# Patient Record
Sex: Male | Born: 1950 | Race: Black or African American | Hispanic: No | Marital: Single | Smoking: Never smoker
Health system: Southern US, Community
[De-identification: ages and names within clinical notes are randomized; demographics above are authoritative.]

---

## 2005-02-15 ENCOUNTER — Emergency Department (HOSPITAL_COMMUNITY): Admission: EM | Admit: 2005-02-15 | Discharge: 2005-02-16 | Payer: Self-pay | Admitting: Emergency Medicine
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
## 2016-12-03 LAB — LIPID PANEL
Cholesterol: 192 (ref 0–200)
HDL: 45 (ref 35–70)
LDL Cholesterol: 128
Triglycerides: 87 (ref 40–160)

## 2016-12-03 LAB — BASIC METABOLIC PANEL
BUN: 26 — AB (ref 4–21)
CREATININE: 1 (ref 0.6–1.3)
GLUCOSE: 95
POTASSIUM: 4.5 (ref 3.4–5.3)
Sodium: 138 (ref 137–147)

## 2016-12-03 LAB — PSA: PSA: 0.4

## 2016-12-03 LAB — CBC AND DIFFERENTIAL
HCT: 41 (ref 41–53)
Hemoglobin: 14 (ref 13.5–17.5)
Platelets: 266 (ref 150–399)
WBC: 5.3

## 2016-12-03 LAB — HEPATIC FUNCTION PANEL
ALT: 17 (ref 10–40)
AST: 20 (ref 14–40)

## 2016-12-03 LAB — TSH: TSH: 1.39 (ref 0.41–5.90)

## 2017-01-27 ENCOUNTER — Ambulatory Visit: Payer: Self-pay | Admitting: Nurse Practitioner

## 2017-01-27 ENCOUNTER — Telehealth: Payer: Self-pay | Admitting: Nurse Practitioner

## 2017-01-27 ENCOUNTER — Ambulatory Visit (INDEPENDENT_AMBULATORY_CARE_PROVIDER_SITE_OTHER): Payer: 59 | Admitting: Nurse Practitioner

## 2017-01-27 ENCOUNTER — Encounter: Payer: Self-pay | Admitting: Nurse Practitioner

## 2017-01-27 VITALS — BP 122/72 | HR 56 | Temp 98.4°F | Ht 67.0 in | Wt 197.0 lb

## 2017-01-27 DIAGNOSIS — Z1211 Encounter for screening for malignant neoplasm of colon: Secondary | ICD-10-CM | POA: Diagnosis not present

## 2017-01-27 DIAGNOSIS — Z23 Encounter for immunization: Secondary | ICD-10-CM

## 2017-01-27 DIAGNOSIS — R195 Other fecal abnormalities: Secondary | ICD-10-CM

## 2017-01-27 DIAGNOSIS — I499 Cardiac arrhythmia, unspecified: Secondary | ICD-10-CM | POA: Diagnosis not present

## 2017-01-27 NOTE — Patient Instructions (Addendum)
Please sign medical release form to get records from Dr. Fleet ContrasEdwin Avbuere.  You will be contacted about cologuard.  Please inquire [facility] if retinal exam and eye pressure was checked during your recent visit. Also have him fax a copy of his exam.   Palpitations A palpitation is the feeling that your heart:  Has an uneven (irregular) heartbeat.  Is beating faster than normal.  Is fluttering.  Is skipping a beat.  This is usually not a serious problem. In some cases, you may need more medical tests. Follow these instructions at home:  Avoid: ? Caffeine in coffee, tea, soft drinks, diet pills, and energy drinks. ? Chocolate. ? Alcohol.  Do not use any tobacco products. These include cigarettes, chewing tobacco, and e-cigarettes. If you need help quitting, ask your doctor.  Try to reduce your stress. These things may help: ? Yoga. ? Meditation. ? Physical activity. Swimming, jogging, and walking are good choices. ? A method that helps you use your mind to control things in your body, like heartbeats (biofeedback).  Get plenty of rest and sleep.  Take over-the-counter and prescription medicines only as told by your doctor.  Keep all follow-up visits as told by your doctor. This is important. Contact a doctor if:  Your heartbeat is still fast or uneven after 24 hours.  Your palpitations occur more often. Get help right away if:  You have chest pain.  You feel short of breath.  You have a very bad headache.  You feel dizzy.  You pass out (faint). This information is not intended to replace advice given to you by your health care provider. Make sure you discuss any questions you have with your health care provider. Document Released: 11/18/2007 Document Revised: 07/17/2015 Document Reviewed: 10/24/2014 Elsevier Interactive Patient Education  [employer]2018 Elsevier Inc.

## 2017-01-27 NOTE — Progress Notes (Signed)
Subjective:    Patient ID: Ronald Lara, male    DOB: 1950-11-16, 66 y.o.   MRN: [id number]  Patient presents today for complete physical  Palpitations   This is a chronic problem. The current episode started more than 1 year ago. The problem occurs intermittently. The problem has been unchanged. The symptoms are aggravated by unknown. Pertinent negatives include no anxiety, chest pain, coughing, dizziness, fever, malaise/fatigue, near-syncope, shortness of breath or syncope. He has tried nothing for the symptoms. Risk factors include being male. There is no history of anxiety or drug use.    previous pcp: Dr. Fleet ContrasEdwin [place] Last seen 11/2016. He reports he had labs done during this OV.  Reports he had [facility] visit [facility] last month.  Immunizations: (TDAP, Hep C screen, Pneumovax, Influenza, zoster)  Health Maintenance  Topic Date Due  .  Hepatitis C: One time screening is recommended by Center for Disease Control  (CDC) for  adults born from [birth year] through 1965.   1950-11-16  . Tetanus Vaccine  01/04/1969  . Colon Cancer Screening  01/05/2000  . Pneumonia vaccines (2 of 2 - PPSV23) 01/27/2018  . Flu Shot  Completed   Colonoscopy (every 5-7818yrs, >50-8187yrs):needed. cologuard will be ordered  PSA (yearly, >5449yrs):needed  Vision:had ophthalmology exam done by dr. Milinda Caveickey with [facility] last month.  Dental:needed   Medications and allergies reviewed with patient and updated if appropriate.  There are no active problems to display for this patient.   No current outpatient medications on file prior to visit.   No current facility-administered medications on file prior to visit.     History reviewed. No pertinent past medical history.  History reviewed. No pertinent surgical history.  Social History   Socioeconomic History  . Marital status: Single    Spouse name: None  . Number of children: None  . Years of education: None  . Highest  education level: 11th grade  Social Needs  . Financial resource strain: None  . Food insecurity - worry: None  . Food insecurity - inability: None  . Transportation needs - medical: None  . Transportation needs - non-medical: None  Occupational History  . Occupation: disability  Tobacco Use  . Smoking status: Never Smoker  . Smokeless tobacco: Never Used  Substance and Sexual Activity  . Alcohol use: No    Frequency: Never  . Drug use: No  . Sexual activity: Not Currently  Other Topics Concern  . None  Social History Narrative  . None    Family History  Problem Relation Age of Onset  . Diabetes Mother   . Heart disease Brother        Review of Systems  Constitutional: Negative for fever, malaise/fatigue and weight loss.  HENT: Negative for congestion and sore throat.   Eyes:       Negative for visual changes  Respiratory: Negative for cough and shortness of breath.   Cardiovascular: Negative for chest pain, palpitations, leg swelling, syncope and near-syncope.  Gastrointestinal: Negative for blood in stool, constipation, diarrhea and heartburn.  Genitourinary: Negative for dysuria, frequency and urgency.  Musculoskeletal: Negative for falls, joint pain and myalgias.  Skin: Negative for rash.  Neurological: Negative for dizziness, sensory change and headaches.  Endo/Heme/Allergies: Does not bruise/bleed easily.  Psychiatric/Behavioral: Negative for depression, substance abuse and suicidal ideas. The patient is not nervous/anxious.     Objective:   Vitals:   01/27/17 1127  BP: 122/72  Pulse: (!) 56  Temp: 98.4 F (36.9 C)  SpO2: 97%    Body mass index is 30.85 kg/m.  ECG indicates Sinus arrhthymia, no previous tracing to compare.  Physical Examination:  Physical Exam  Constitutional: He is oriented to person, place, and time. No distress.  Cardiovascular: Intact distal pulses. An irregular rhythm present. Exam reveals no gallop.  No murmur  heard. Pulmonary/Chest: Effort normal and breath sounds normal.  Musculoskeletal: He exhibits no edema.  Neurological: He is alert and oriented to person, place, and time. Gait normal.  Skin: Skin is warm and dry.  Psychiatric: Affect normal.  Vitals reviewed.   ASSESSMENT and PLAN:  Ronald Lara was seen today for establish care.  Diagnoses and all orders for this visit:  Irregular heart rate -     EKG 12-Lead  Screening for colon cancer  Need for vaccination with 13-polyvalent pneumococcal conjugate vaccine -     Pneumococcal conjugate vaccine 13-valent IM   No problem-specific Assessment & Plan notes found for this encounter.     Follow up: Return in about 4 weeks (around 02/24/2017) for BP re eval..  Alysia Pennaharlotte Donny Heffern, NP

## 2017-01-27 NOTE — Telephone Encounter (Signed)
Cologuard ordered. Order [id number].   Waiting for the result.

## 2017-01-28 ENCOUNTER — Encounter: Payer: Self-pay | Admitting: Nurse Practitioner

## 2017-02-02 ENCOUNTER — Encounter: Payer: Self-pay | Admitting: Nurse Practitioner

## 2017-02-02 NOTE — Progress Notes (Signed)
Abstracted result and sent to scan  

## 2017-02-25 ENCOUNTER — Encounter: Payer: Self-pay | Admitting: Nurse Practitioner

## 2017-02-25 ENCOUNTER — Ambulatory Visit (INDEPENDENT_AMBULATORY_CARE_PROVIDER_SITE_OTHER): Payer: 59 | Admitting: Nurse Practitioner

## 2017-02-25 VITALS — BP 130/70 | HR 56 | Temp 98.2°F | Ht 67.0 in | Wt 201.0 lb

## 2017-02-25 DIAGNOSIS — I498 Other specified cardiac arrhythmias: Secondary | ICD-10-CM | POA: Insufficient documentation

## 2017-02-25 NOTE — Patient Instructions (Signed)
Contact your insurance about cost of cologuard.   Bradycardia, Adult Bradycardia is a slower-than-normal heartbeat. A normal resting heart rate for an adult ranges from 60 to 100 beats per minute. With bradycardia, the resting heart rate is less than 60 beats per minute. Bradycardia can prevent enough oxygen from reaching certain areas of your body when you are active. It can be serious if it keeps enough oxygen from reaching your brain and other parts of your body. Bradycardia is not a problem for everyone. For some healthy adults, a slow resting heart rate is normal. What are the causes? This condition may be caused by:  A problem with the heart, including: ? A problem with the heart's electrical system, such as a heart block. ? A problem with the heart's natural pacemaker (sinus node). ? Heart disease. ? A heart attack. ? Heart damage. ? A heart infection. ? A heart condition that is present at birth (congenital heart defect).  Certain medicines that treat heart conditions.  Certain conditions, such as hypothyroidism and obstructive sleep apnea.  Problems with the balance of chemicals and other substances, like potassium, in the blood.  What increases the risk? This condition is more likely to develop in adults who:  Are age 68 or older.  Have high blood pressure (hypertension), high cholesterol (hyperlipidemia), or diabetes.  Drink heavily, use tobacco or nicotine products, or use drugs.  Are stressed.  What are the signs or symptoms? Symptoms of this condition include:  Light-headedness.  Feeling faint or fainting.  Fatigue and weakness.  Shortness of breath.  Chest pain (angina).  Drowsiness.  Confusion.  Dizziness.  How is this diagnosed? This condition may be diagnosed based on:  Your symptoms.  Your medical history.  A physical exam.  During the exam, your health care provider will listen to your heartbeat and check your pulse. To confirm the  diagnosis, your health care provider may order tests, such as:  Blood tests.  An electrocardiogram (ECG). This test records the heart's electrical activity. The test can show how fast your heart is beating and whether the heartbeat is steady.  A test in which you wear a portable device (event recorder or Holter monitor) to record your heart's electrical activity while you go about your day.  Anexercise test.  How is this treated? Treatment for this condition depends on the cause of the condition and how severe your symptoms are. Treatment may involve:  Treatment of the underlying condition.  Changing your medicines or how much medicine you take.  Having a small, battery-operated device called a pacemaker implanted under the skin. When bradycardia occurs, this device can be used to increase your heart rate and help your heart to beat in a regular rhythm.  Follow these instructions at home: Lifestyle   Manage any health conditions that contribute to bradycardia as told by your health care provider.  Follow a heart-healthy diet. A nutrition specialist (dietitian) can help to educate you about healthy food options and changes.  Follow an exercise program that is approved by your health care provider.  Maintain a healthy weight.  Try to reduce or manage your stress, such as with yoga or meditation. If you need help reducing stress, ask your health care provider.  Do not use use any products that contain nicotine or tobacco, such as cigarettes and e-cigarettes. If you need help quitting, ask your health care provider.  Do not use illegal drugs.  Limit alcohol intake to no more than 1 drink  per day for nonpregnant women and 2 drinks per day for men. One drink equals 12 oz of beer, 5 oz of wine, or 1 oz of hard liquor. General instructions  Take over-the-counter and prescription medicines only as told by your health care provider.  Keep all follow-up visits as directed by your  health care provider. This is important. How is this prevented? In some cases, bradycardia may be prevented by:  Treating underlying medical problems.  Stopping behaviors or medicines that can trigger the condition.  Contact a health care provider if:  You feel light-headed or dizzy.  You almost faint.  You feel weak or are easily fatigued during physical activity.  You experience confusion or have memory problems. Get help right away if:  You faint.  You have an irregular heartbeat (palpitations).  You have chest pain.  You have trouble breathing. This information is not intended to replace advice given to you by your health care provider. Make sure you discuss any questions you have with your health care provider. Document Released: 10/31/2001 Document Revised: 10/07/2015 Document Reviewed: 07/31/2015 Elsevier Interactive Patient Education  2017 [employer].
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
## 2017-03-08 LAB — COLOGUARD: Cologuard: POSITIVE

## 2017-03-17 ENCOUNTER — Encounter: Payer: Self-pay | Admitting: Nurse Practitioner

## 2017-03-17 ENCOUNTER — Telehealth: Payer: Self-pay | Admitting: Nurse Practitioner

## 2017-03-17 NOTE — Telephone Encounter (Signed)
Received Cologuard report for positive result. Please advise in absence of [place] today.

## 2017-03-17 NOTE — Telephone Encounter (Signed)
Please call pt to let him know that cologuard was positive and we therefore advise GI referral for colonoscopy/further evaluation.  Please let me know if he is okay with this and I will place the referral.

## 2017-03-17 NOTE — Telephone Encounter (Signed)
Referral placed. Thank you

## 2017-03-17 NOTE — Telephone Encounter (Signed)
Received report today. Waiting for response from Dr. Dayton MartesAron due to Charlotte's absence today.

## 2017-03-17 NOTE — Telephone Encounter (Signed)
Copied from CRM [phone]. Topic: Quick Communication - See Telephone Encounter >> Mar 17, 2017  8:49 AM Oneal GroutSebastian, Jennifer S wrote: CRM for notification. See Telephone encounter for: [employer] calling to make sure colorguard results were received  03/17/17.

## 2017-03-17 NOTE — Addendum Note (Signed)
Addended by: Dianne DunARON, TALIA M on: 03/17/2017 12:01 PM   Modules accepted: Orders

## 2017-03-17 NOTE — Telephone Encounter (Signed)
Spoke with the pt,advise of test result. He is okey with us refer him out to GI department for more evaluation. Please place an order.

## 2017-03-17 NOTE — Progress Notes (Signed)
Abstracted result and sent to scan  

## 2017-04-18 ENCOUNTER — Encounter: Payer: Self-pay | Admitting: Family Medicine

## 2018-02-27 ENCOUNTER — Encounter: Payer: Self-pay | Admitting: Nurse Practitioner

## 2018-02-27 ENCOUNTER — Ambulatory Visit (INDEPENDENT_AMBULATORY_CARE_PROVIDER_SITE_OTHER): Payer: 59 | Admitting: Nurse Practitioner

## 2018-02-27 VITALS — BP 130/70 | HR 78 | Temp 98.8°F | Ht 67.0 in | Wt 205.0 lb

## 2018-02-27 DIAGNOSIS — Z136 Encounter for screening for cardiovascular disorders: Secondary | ICD-10-CM | POA: Diagnosis not present

## 2018-02-27 DIAGNOSIS — Z125 Encounter for screening for malignant neoplasm of prostate: Secondary | ICD-10-CM

## 2018-02-27 DIAGNOSIS — Z13220 Encounter for screening for lipoid disorders: Secondary | ICD-10-CM | POA: Diagnosis not present

## 2018-02-27 DIAGNOSIS — Z23 Encounter for immunization: Secondary | ICD-10-CM | POA: Diagnosis not present

## 2018-02-27 DIAGNOSIS — Z1159 Encounter for screening for other viral diseases: Secondary | ICD-10-CM

## 2018-02-27 DIAGNOSIS — Z0000 Encounter for general adult medical examination without abnormal findings: Secondary | ICD-10-CM

## 2018-02-27 LAB — COMPREHENSIVE METABOLIC PANEL
ALK PHOS: 55 U/L (ref 39–117)
ALT: 11 U/L (ref 0–53)
AST: 13 U/L (ref 0–37)
Albumin: 5 g/dL (ref 3.5–5.2)
BUN: 11 mg/dL (ref 6–23)
CO2: 28 mEq/L (ref 19–32)
Calcium: 10.2 mg/dL (ref 8.4–10.5)
Chloride: 105 mEq/L (ref 96–112)
Creatinine, Ser: 0.71 mg/dL (ref 0.40–1.50)
GFR: 141.83 mL/min (ref >60.00)
GLUCOSE: 108 mg/dL — AB (ref 70–99)
POTASSIUM: 3.9 meq/L (ref 3.5–5.1)
SODIUM: 141 meq/L (ref 135–145)
TOTAL PROTEIN: 7 g/dL (ref 6.0–8.3)
Total Bilirubin: 1.6 mg/dL — ABNORMAL HIGH (ref 0.2–1.2)

## 2018-02-27 LAB — CBC
HEMATOCRIT: 47.2 % (ref 39.0–52.0)
Hemoglobin: 16.4 g/dL (ref 13.0–17.0)
MCHC: 34.8 g/dL (ref 30.0–36.0)
MCV: 92.4 fl (ref 78.0–100.0)
Platelets: 258 10*3/uL (ref 150.0–400.0)
RBC: 5.11 Mil/uL (ref 4.22–5.81)
RDW: 12.4 % (ref 11.5–15.5)
WBC: 3.9 10*3/uL — AB (ref 4.0–10.5)

## 2018-02-27 LAB — LIPID PANEL
CHOL/HDL RATIO: 3
Cholesterol: 114 mg/dL (ref 0–200)
HDL: 39.6 mg/dL (ref >39.00)
LDL Cholesterol: 52 mg/dL (ref 0–99)
NONHDL: 74.23
Triglycerides: 112 mg/dL (ref 0.0–149.0)
VLDL: 22.4 mg/dL (ref 0.0–40.0)

## 2018-02-27 LAB — PSA, MEDICARE: PSA: 0.37 ng/ml (ref 0.10–4.00)

## 2018-02-27 NOTE — Progress Notes (Signed)
Subjective:    Patient ID: Ronald Lara, male    DOB: Apr 15, 1950, 68 y.o.   MRN: [id number]  Patient presents today for complete physical  HPI  denies any acute complains.  States he was unaware of abnormal cologuard results, so he did not schedule appt with GI.  Sexual History (orientation,birth control, marital status, STD):single, not sexually active  Depression/Suicide: [facility]27/[facility]  Decreased Interest 0  Down, Depressed, Hopeless 0  PHQ - 2 Score 0   Vision:declines need for eye exam  Dental:declines need  Immunizations: (TDAP, Hep C screen, Pneumovax, Influenza, zoster)  Health Maintenance  Topic Date Due  .  Hepatitis C: One time screening is recommended by Center for Disease Control  (CDC) for  adults born from [birth year] through 1965.   Apr 15, 1950  . Colon Cancer Screening  01/05/2000  . Tetanus Vaccine  02/28/2019*  . Flu Shot  Completed  . Pneumonia vaccines  Completed  *Topic was postponed. The date shown is not the original due date.   Diet:regular.  Weight:  Wt Readings from Last 3 Encounters:  02/27/18 205 lb (93 kg)  02/25/17 201 lb (91.2 kg)  01/27/17 197 lb (89.4 kg)    Exercise:none  Fall Risk: Fall Risk  02/27/2018  Falls in the past year? 0   Advanced Directive:declined need   Medications and allergies reviewed with patient and updated if appropriate.  Patient Active Problem List   Diagnosis Date Noted  . 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   No current facility-administered medications on file prior to visit.     No past medical history on file.  No past surgical history on file.  Social History   Socioeconomic History  . Marital status: Single    Spouse name: Not on file  . Number of children: Not on file  . Years of education: Not on file  . Highest education level: 11th grade  Occupational History  . Occupation: disability  Social Needs  .  Financial resource strain: Not on file  . Food insecurity:    Worry: Not on file    Inability: Not on file  . Transportation needs:    Medical: Not on file    Non-medical: Not on file  Tobacco Use  . Smoking status: Never Smoker  . Smokeless tobacco: Never Used  Substance and Sexual Activity  . Alcohol use: No    Frequency: Never  . Drug use: No  . Sexual activity: Not Currently  Lifestyle  . Physical activity:    Days per week: Not on file    Minutes per session: Not on file  . Stress: Not on file  Relationships  . Social connections:    Talks on phone: Not on file    Gets together: Not on file    Attends religious service: Not on file    Active member of club or organization: Not on file    Attends meetings of clubs or organizations: Not on file    Relationship status: Not on file  Other Topics Concern  . Not on file  Social History Narrative  . Not on file    Family History  Problem Relation Age of Onset  . Diabetes Mother   . Heart disease Brother         Review of Systems  Constitutional: Negative for fever, malaise/fatigue and weight loss.  HENT: Negative for congestion and sore throat.   Eyes:  Negative for visual changes  Respiratory: Negative for cough and shortness of breath.   Cardiovascular: Negative for chest pain, palpitations and leg swelling.  Gastrointestinal: Negative for blood in stool, constipation, diarrhea and heartburn.  Genitourinary: Negative for dysuria, frequency and urgency.  Musculoskeletal: Negative for falls, joint pain and myalgias.  Skin: Negative for rash.  Neurological: Negative for dizziness, sensory change and headaches.  Endo/Heme/Allergies: Does not bruise/bleed easily.  Psychiatric/Behavioral: Negative for depression, memory loss, substance abuse and suicidal ideas. The patient is not nervous/anxious and does not have insomnia.     Objective:   Vitals:   02/27/18 0812  BP: 130/70  Pulse: 78  Temp: 98.8 F  (37.1 C)  SpO2: 97%    Body mass index is 32.11 kg/m.   Physical Examination:  Physical Exam Vitals signs reviewed.  Constitutional:      Appearance: He is obese.  HENT:     Right Ear: Tympanic membrane, ear canal and external ear normal.     Left Ear: Tympanic membrane, ear canal and external ear normal.     Nose: Nose normal.  Eyes:     Extraocular Movements: Extraocular movements intact.     Conjunctiva/sclera: Conjunctivae normal.     Pupils: Pupils are equal, round, and reactive to light.  Neck:     Musculoskeletal: Normal range of motion and neck supple.  Cardiovascular:     Rate and Rhythm: Normal rate and regular rhythm.     Pulses: Normal pulses.     Heart sounds: Normal heart sounds.  Pulmonary:     Effort: Pulmonary effort is normal.     Breath sounds: Normal breath sounds.  Abdominal:     General: Bowel sounds are normal.     Palpations: Abdomen is soft.  Genitourinary:    Comments: Declined prostate exam Musculoskeletal:     Right lower leg: No edema.     Left lower leg: No edema.  Skin:    Findings: No rash.  Neurological:     Mental Status: He is alert.  Psychiatric:        Mood and Affect: Mood normal.        Behavior: Behavior normal.        Thought Content: Thought content normal.     ASSESSMENT and PLAN:  Kanav was seen today for annual exam.  Diagnoses and all orders for this visit:  Preventative health care -     CBC -     Comprehensive metabolic panel -     Lipid panel -     PSA, Medicare -     Hepatitis C Antibody  Prostate cancer screening -     PSA, Medicare  Encounter for lipid screening for cardiovascular disease -     Lipid panel  Encounter for hepatitis C screening test for low risk patient -     Hepatitis C Antibody  Need for influenza vaccination -     Flu vaccine HIGH DOSE PF (Fluzone High dose)   No problem-specific Assessment & Plan notes found for this encounter.     Problem List Items Addressed This  Visit    None    Visit Diagnoses    Preventative health care    -  Primary   Relevant Orders   CBC   Comprehensive metabolic panel   Lipid panel   PSA, Medicare   Hepatitis C Antibody   Prostate cancer screening       Relevant Orders   PSA, Medicare  Encounter for lipid screening for cardiovascular disease       Relevant Orders   Lipid panel   Encounter for hepatitis C screening test for low risk patient       Relevant Orders   Hepatitis C Antibody   Need for influenza vaccination       Relevant Orders   Flu vaccine HIGH DOSE PF (Fluzone High dose) (Completed)       Follow up: No follow-ups on file.  Alysia Pennaharlotte Debbera Wolken, NP

## 2018-02-27 NOTE — Patient Instructions (Addendum)
Schedule appt with GI for colonoscopy due to abnormal cologuard results.  Normal lab results.   Health Maintenance, Male A healthy lifestyle and preventive care is important for your health and wellness. Ask your health care provider about what schedule of regular examinations is right for you. What should I know about weight and diet? Eat a Healthy Diet  Eat plenty of vegetables, fruits, whole grains, low-fat dairy products, and lean protein.  Do not eat a lot of foods high in solid fats, added sugars, or salt.  Maintain a Healthy Weight Regular exercise can help you achieve or maintain a healthy weight. You should:  Do at least 150 minutes of exercise each week. The exercise should increase your heart rate and make you sweat (moderate-intensity exercise).  Do strength-training exercises at least twice a week. Watch Your Levels of Cholesterol and Blood Lipids  Have your blood tested for lipids and cholesterol every 5 years starting at 69 years of age. If you are at high risk for heart disease, you should start having your blood tested when you are 69 years old. You may need to have your cholesterol levels checked more often if: ? Your lipid or cholesterol levels are high. ? You are older than 69 years of age. ? You are at high risk for heart disease. What should I know about cancer screening? Many types of cancers can be detected early and may often be prevented. Lung Cancer  You should be screened every year for lung cancer if: ? You are a current smoker who has smoked for at least 30 years. ? You are a former smoker who has quit within the past 15 years.  Talk to your health care provider about your screening options, when you should start screening, and how often you should be screened. Colorectal Cancer  Routine colorectal cancer screening usually begins at 69 years of age and should be repeated every 5-10 years until you are 69 years old. You may need to be screened more  often if early forms of precancerous polyps or small growths are found. Your health care provider may recommend screening at an earlier age if you have risk factors for colon cancer.  Your health care provider may recommend using home test kits to check for hidden blood in the stool.  A small camera at the end of a tube can be used to examine your colon (sigmoidoscopy or colonoscopy). This checks for the earliest forms of colorectal cancer. Prostate and Testicular Cancer  Depending on your age and overall health, your health care provider may do certain tests to screen for prostate and testicular cancer.  Talk to your health care provider about any symptoms or concerns you have about testicular or prostate cancer. Skin Cancer  Check your skin from head to toe regularly.  Tell your health care provider about any new moles or changes in moles, especially if: ? There is a change in a mole's size, shape, or color. ? You have a mole that is larger than a pencil eraser.  Always use sunscreen. Apply sunscreen liberally and repeat throughout the day.  Protect yourself by wearing long sleeves, pants, a wide-brimmed hat, and sunglasses when outside. What should I know about heart disease, diabetes, and high blood pressure?  If you are 18-65 years of age, have your blood pressure checked every 3-5 years. If you are 26 years of age or older, have your blood pressure checked every year. You should have your blood pressure measured twice-once  when you are at a hospital or clinic, and once when you are not at a hospital or clinic. Record the average of the two measurements. To check your blood pressure when you are not at a hospital or clinic, you can use: ? An automated blood pressure machine at a pharmacy. ? A home blood pressure monitor.  Talk to your health care provider about your target blood pressure.  If you are between 4245-69 years old, ask your health care provider if you should take aspirin  to prevent heart disease.  Have regular diabetes screenings by checking your fasting blood sugar level. ? If you are at a normal weight and have a low risk for diabetes, have this test once every three years after the age of 69. ? If you are overweight and have a high risk for diabetes, consider being tested at a younger age or more often.  A one-time screening for abdominal aortic aneurysm (AAA) by ultrasound is recommended for men aged 65-75 years who are current or former smokers. What should I know about preventing infection? Hepatitis B If you have a higher risk for hepatitis B, you should be screened for this virus. Talk with your health care provider to find out if you are at risk for hepatitis B infection. Hepatitis C Blood testing is recommended for:  Everyone born from [id number] through 1966.  Anyone with known risk factors for hepatitis C. Sexually Transmitted Diseases (STDs)  You should be screened each year for STDs including gonorrhea and chlamydia if: ? You are sexually active and are younger than 69 years of age. ? You are older than 69 years of age and your health care provider tells you that you are at risk for this type of infection. ? Your sexual activity has changed since you were last screened and you are at an increased risk for chlamydia or gonorrhea. Ask your health care provider if you are at risk.  Talk with your health care provider about whether you are at high risk of being infected with HIV. Your health care provider may recommend a prescription medicine to help prevent HIV infection. What else can I do?  Schedule regular health, dental, and eye exams.  Stay current with your vaccines (immunizations).  Do not use any tobacco products, such as cigarettes, chewing tobacco, and e-cigarettes. If you need help quitting, ask your health care provider.  Limit alcohol intake to no more than 2 drinks per day. One drink equals 12 ounces of beer, 5 ounces of wine, or 1  ounces of hard liquor.  Do not use street drugs.  Do not share needles.  Ask your health care provider for help if you need support or information about quitting drugs.  Tell your health care provider if you often feel depressed.  Tell your health care provider if you have ever been abused or do not feel safe at home. This information is not intended to replace advice given to you by your health care provider. Make sure you discuss any questions you have with your health care provider. Document Released: 08/07/2007 Document Revised: 10/08/2015 Document Reviewed: 11/12/2014 Elsevier Interactive Patient Education  2019 [employer]

## 2018-02-28 LAB — HEPATITIS C ANTIBODY
HEP C AB: NONREACTIVE
SIGNAL TO CUT-OFF: 0.02 (ref <1.00)

## 2018-03-02 ENCOUNTER — Encounter: Payer: Self-pay | Admitting: Nurse Practitioner

## 2019-08-13 ENCOUNTER — Other Ambulatory Visit: Payer: Self-pay

## 2019-08-14 ENCOUNTER — Encounter: Payer: Self-pay | Admitting: Nurse Practitioner

## 2019-08-14 ENCOUNTER — Ambulatory Visit (INDEPENDENT_AMBULATORY_CARE_PROVIDER_SITE_OTHER): Payer: Medicare Other | Admitting: Nurse Practitioner

## 2019-08-14 VITALS — BP 130/72 | HR 67 | Temp 96.3°F | Ht 67.0 in | Wt 208.0 lb

## 2019-08-14 DIAGNOSIS — S40261A Insect bite (nonvenomous) of right shoulder, initial encounter: Secondary | ICD-10-CM | POA: Diagnosis not present

## 2019-08-14 DIAGNOSIS — W57XXXA Bitten or stung by nonvenomous insect and other nonvenomous arthropods, initial encounter: Secondary | ICD-10-CM

## 2019-08-14 MED ORDER — CEPHALEXIN 500 MG PO CAPS: 500.0000 mg | ORAL_CAPSULE | Freq: Two times a day (BID) | ORAL | 0 refills | Status: DC

## 2019-08-14 MED ORDER — DOXYCYCLINE HYCLATE 100 MG PO TABS: 100.0000 mg | ORAL_TABLET | Freq: Two times a day (BID) | ORAL | 0 refills | Status: AC

## 2019-08-14 MED ORDER — TRIAMCINOLONE ACETONIDE 0.1 % EX CREA: 1.0000 "application " | TOPICAL_CREAM | Freq: Two times a day (BID) | CUTANEOUS | 0 refills | Status: DC

## 2019-08-14 NOTE — Progress Notes (Signed)
Subjective:  Patient ID: Ronald Lara, male    DOB: 11/06/1950  Age: 69 y.o. MRN: [id number]  CC: Acute Visit (spot on right shoulder dark in color no pain or itching but has a hole in ceter and wondering if its alergic to mask he put on at eye dr.-its been there for several weeks)  Rash This is a new problem. The current episode started 1 to 4 weeks ago. The problem is unchanged. The affected locations include the right shoulder. The rash is characterized by redness and itchiness. It is unknown if there was an exposure to a precipitant. Pertinent negatives include no facial edema, fatigue, fever, joint pain or nail changes. Past treatments include nothing. There is no history of allergies, eczema or varicella.   Reviewed past Medical, Social and Family history today.  No outpatient medications prior to visit.   No facility-administered medications prior to visit.    ROS See HPI  Objective:  BP 130/72   Pulse 67   Temp (!) 96.3 F (35.7 C) (Tympanic)   Ht 5\' 7"  (1.702 m)   Wt 208 lb (94.3 kg)   SpO2 98%   BMI 32.58 kg/m   BP Readings from Last 3 Encounters:  08/14/19 130/72  02/27/18 130/70  02/25/17 130/70    Wt Readings from Last 3 Encounters:  08/14/19 208 lb (94.3 kg)  02/27/18 205 lb (93 kg)  02/25/17 201 lb (91.2 kg)    Physical Exam Cardiovascular:     Rate and Rhythm: Normal rate.  Pulmonary:     Effort: Pulmonary effort is normal.  Musculoskeletal:        General: No swelling or tenderness.  Skin:    Findings: Rash present. No erythema. Rash is papular.          Comments: Hyperpigmented nodule at center, surrounding macular rash, no tenderness, no drainage.  Neurological:     Mental Status: He is alert and oriented to person, place, and time.     Lab Results  Component Value Date   WBC 3.9 (L) 02/27/2018   HGB 16.4 02/27/2018   HCT 47.2 02/27/2018   PLT 258.0 02/27/2018   GLUCOSE 108 (H) 02/27/2018   CHOL 114 02/27/2018   TRIG 112.0  02/27/2018   HDL 39.60 02/27/2018   LDLCALC 52 02/27/2018   ALT 11 02/27/2018   AST 13 02/27/2018   NA 141 02/27/2018   K 3.9 02/27/2018   CL 105 02/27/2018   CREATININE 0.71 02/27/2018   BUN 11 02/27/2018   CO2 28 02/27/2018   TSH 1.39 12/03/2016   PSA 0.37 02/27/2018   Assessment & Plan:  This visit occurred during the SARS-CoV-2 public health emergency.  Safety protocols were in place, including screening questions prior to the visit, additional usage of staff PPE, and extensive cleaning of exam room while observing appropriate contact time as indicated for disinfecting solutions.   Ronald Lara was seen today for acute visit.  Diagnoses and all orders for this visit:  Insect bite of right shoulder, initial encounter -     Discontinue: cephALEXin (KEFLEX) 500 MG capsule; Take 1 capsule (500 mg total) by mouth 2 (two) times daily. -     triamcinolone cream (KENALOG) 0.1 %; Apply 1 application topically 2 (two) times daily. -     doxycycline (VIBRA-TABS) 100 MG tablet; Take 1 tablet (100 mg total) by mouth 2 (two) times daily for 7 days.   I have discontinued Ronald Lara's cephALEXin. I am also having him start  on triamcinolone cream and doxycycline.  Meds ordered this encounter  Medications  . DISCONTD: cephALEXin (KEFLEX) 500 MG capsule    Sig: Take 1 capsule (500 mg total) by mouth 2 (two) times daily.    Dispense:  14 capsule    Refill:  0    Order Specific Question:   Supervising Provider    Answer:   Ronnald Nian [[id number]]  . triamcinolone cream (KENALOG) 0.1 %    Sig: Apply 1 application topically 2 (two) times daily.    Dispense:  30 g    Refill:  0    Order Specific Question:   Supervising Provider    Answer:   Ronnald Nian [[id number]]  . doxycycline (VIBRA-TABS) 100 MG tablet    Sig: Take 1 tablet (100 mg total) by mouth 2 (two) times daily for 7 days.    Dispense:  14 tablet    Refill:  0    Do not fill keflex    Order Specific Question:    Supervising Provider    Answer:   Ronnald Nian [[id number]]    Problem List Items Addressed This Visit    None    Visit Diagnoses    Insect bite of right shoulder, initial encounter    -  Primary   Relevant Medications   triamcinolone cream (KENALOG) 0.1 %   doxycycline (VIBRA-TABS) 100 MG tablet      Follow-up: Return in about 4 weeks (around 09/11/2019) for CPE (fasting).  Wilfred Lacy, NP

## 2019-08-14 NOTE — Patient Instructions (Signed)
Rash is due to an insect bite.   Rash, Adult  A rash is a change in the color of your skin. A rash can also change the way your skin feels. There are many different conditions and factors that can cause a rash. Follow these instructions at home: The goal of treatment is to stop the itching and keep the rash from spreading. Watch for any changes in your symptoms. Let your doctor know about them. Follow these instructions to help with your condition: Medicine Take or apply over-the-counter and prescription medicines only as told by your doctor. These may include medicines:  To treat red or swollen skin (corticosteroid creams).  To treat itching.  To treat an allergy (oral antihistamines).  To treat very bad symptoms (oral corticosteroids).  Skin care  Put cool cloths (compresses) on the affected areas.  Do not scratch or rub your skin.  Avoid covering the rash. Make sure that the rash is exposed to air as much as possible. Managing itching and discomfort  Avoid hot showers or baths. These can make itching worse. A cold shower may help.  Try taking a bath with: ? Epsom salts. You can get these at your local pharmacy or grocery store. Follow the instructions on the package. ? Baking soda. Pour a small amount into the bath as told by your doctor. ? Colloidal oatmeal. You can get this at your local pharmacy or grocery store. Follow the instructions on the package.  Try putting baking soda paste onto your skin. Stir water into baking soda until it gets like a paste.  Try putting on a lotion that relieves itchiness (calamine lotion).  Keep cool and out of the sun. Sweating and being hot can make itching worse. General instructions   Rest as needed.  Drink enough fluid to keep your pee (urine) pale yellow.  Wear loose-fitting clothing.  Avoid scented soaps, detergents, and perfumes. Use gentle soaps, detergents, perfumes, and other cosmetic products.  Avoid anything that  causes your rash. Keep a journal to help track what causes your rash. Write down: ? What you eat. ? What cosmetic products you use. ? What you drink. ? What you wear. This includes jewelry.  Keep all follow-up visits as told by your doctor. This is important. Contact a doctor if:  You sweat at night.  You lose weight.  You pee (urinate) more than normal.  You pee less than normal, or you notice that your pee is a darker color than normal.  You feel weak.  You throw up (vomit).  Your skin or the whites of your eyes look yellow (jaundice).  Your skin: ? Tingles. ? Is numb.  Your rash: ? Does not go away after a few days. ? Gets worse.  You are: ? More thirsty than normal. ? More tired than normal.  You have: ? New symptoms. ? Pain in your belly (abdomen). ? A fever. ? Watery poop (diarrhea). Get help right away if:  You have a fever and your symptoms suddenly get worse.  You start to feel mixed up (confused).  You have a very bad headache or a stiff neck.  You have very bad joint pains or stiffness.  You have jerky movements that you cannot control (seizure).  Your rash covers all or most of your body. The rash may or may not be painful.  You have blisters that: ? Are on top of the rash. ? Grow larger. ? Grow together. ? Are painful. ? Are inside  your nose or mouth.  You have a rash that: ? Looks like purple pinprick-sized spots all over your body. ? Has a "bull's eye" or looks like a target. ? Is red and painful, causes your skin to peel, and is not from being in the sun too long. Summary  A rash is a change in the color of your skin. A rash can also change the way your skin feels.  The goal of treatment is to stop the itching and keep the rash from spreading.  Take or apply over-the-counter and prescription medicines only as told by your doctor.  Contact a doctor if you have new symptoms or symptoms that get worse.  Keep all follow-up visits  as told by your doctor. This is important. This information is not intended to replace advice given to you by your health care provider. Make sure you discuss any questions you have with your health care provider. Document Revised: 06/02/2018 Document Reviewed: 09/12/2017 Elsevier Patient Education  [employer].

## 2019-08-22 ENCOUNTER — Other Ambulatory Visit: Payer: Self-pay | Admitting: Nurse Practitioner

## 2019-08-22 DIAGNOSIS — W57XXXA Bitten or stung by nonvenomous insect and other nonvenomous arthropods, initial encounter: Secondary | ICD-10-CM

## 2019-08-23 ENCOUNTER — Telehealth: Payer: Self-pay | Admitting: Nurse Practitioner

## 2019-08-23 DIAGNOSIS — S40261A Insect bite (nonvenomous) of right shoulder, initial encounter: Secondary | ICD-10-CM

## 2019-08-23 NOTE — Telephone Encounter (Signed)
Patient is calling and requesting a refill for triamcinolone cream. Patient stated that he is still experiencing itching and medication is almost out, please advise. CB is [phone].

## 2019-08-23 NOTE — Telephone Encounter (Signed)
If possible, can he send a picture via mychart?

## 2019-08-23 NOTE — Telephone Encounter (Signed)
Left pt a voicemail to give us a call back. 

## 2019-08-23 NOTE — Telephone Encounter (Signed)
Charlotte please advise.  Pt asking for refill on:  Triamcinolone cream 30g  0-refill Last fill-08/14/19 Last OV-08/14/19  Pt states the rash that's on his shoulder is still very itchy but not hot to touch.

## 2019-08-24 MED ORDER — TRIAMCINOLONE ACETONIDE 0.1 % EX CREA: 1.0000 "application " | TOPICAL_CREAM | Freq: Two times a day (BID) | CUTANEOUS | 0 refills | Status: DC

## 2019-08-24 NOTE — Telephone Encounter (Signed)
Ok to refill triamcinolone.

## 2019-08-24 NOTE — Addendum Note (Signed)
Addended by: Rene Paci on: 08/24/2019 02:34 PM   Modules accepted: Orders

## 2019-08-24 NOTE — Telephone Encounter (Signed)
Rx sent and pt was informed. 

## 2019-08-24 NOTE — Telephone Encounter (Signed)
Charlotte please advise.  Pt states he doesn't have My Chart to send a picture.. Pt states he thinks its healing an the black circle in it is starting to go away but may need another round of the triamcinolone cream. He thinks its getting smaller with the cream.

## 2019-09-13 ENCOUNTER — Encounter: Payer: Medicare Other | Admitting: Nurse Practitioner

## 2019-09-21 ENCOUNTER — Encounter: Payer: Self-pay | Admitting: Nurse Practitioner

## 2019-09-21 ENCOUNTER — Telehealth: Payer: Self-pay | Admitting: Nurse Practitioner

## 2019-09-21 NOTE — Telephone Encounter (Signed)
Pt was no show for appt 09/13/19. First occurrence. Fee waived. Letter mailed.  PCP,  Please reply back with corresponding letter matching appropriate follow up needs.  A - No follow up necessary B - Follow up urgent - locate patient immediately to schedule appointment. C - Follow up necessary. Contact patient and schedule visit w/in 7 days. D - Follow up necessary. Contact patient and schedule visit w/in 2-4 weeks.  E - Follow up necessary. Contact patient and schedule visit w/in 3 months.

## 2019-09-24 ENCOUNTER — Telehealth: Payer: Self-pay | Admitting: Nurse Practitioner

## 2019-09-24 NOTE — Telephone Encounter (Signed)
Please contact pt to schedule.   

## 2019-09-24 NOTE — Telephone Encounter (Signed)
LVM to call back to see if patient wanted to reschedule appointment from 7/22.

## 2019-09-24 NOTE — Telephone Encounter (Signed)
C-

## 2019-10-04 ENCOUNTER — Other Ambulatory Visit: Payer: Self-pay

## 2019-10-05 ENCOUNTER — Ambulatory Visit (INDEPENDENT_AMBULATORY_CARE_PROVIDER_SITE_OTHER): Payer: Medicare Other | Admitting: Nurse Practitioner

## 2019-10-05 ENCOUNTER — Encounter: Payer: Self-pay | Admitting: Nurse Practitioner

## 2019-10-05 VITALS — BP 128/80 | HR 58 | Temp 97.5°F | Ht 66.0 in | Wt 197.4 lb

## 2019-10-05 DIAGNOSIS — L239 Allergic contact dermatitis, unspecified cause: Secondary | ICD-10-CM

## 2019-10-05 DIAGNOSIS — Z13220 Encounter for screening for lipoid disorders: Secondary | ICD-10-CM | POA: Diagnosis not present

## 2019-10-05 DIAGNOSIS — Z125 Encounter for screening for malignant neoplasm of prostate: Secondary | ICD-10-CM

## 2019-10-05 DIAGNOSIS — Z0000 Encounter for general adult medical examination without abnormal findings: Secondary | ICD-10-CM

## 2019-10-05 DIAGNOSIS — Z136 Encounter for screening for cardiovascular disorders: Secondary | ICD-10-CM | POA: Diagnosis not present

## 2019-10-05 DIAGNOSIS — R195 Other fecal abnormalities: Secondary | ICD-10-CM

## 2019-10-05 LAB — COMPREHENSIVE METABOLIC PANEL
ALT: 16 U/L (ref 0–53)
AST: 21 U/L (ref 0–37)
Albumin: 4.4 g/dL (ref 3.5–5.2)
Alkaline Phosphatase: 41 U/L (ref 39–117)
BUN: 16 mg/dL (ref 6–23)
CO2: 25 mEq/L (ref 19–32)
Calcium: 9.2 mg/dL (ref 8.4–10.5)
Chloride: 106 mEq/L (ref 96–112)
Creatinine, Ser: 1.01 mg/dL (ref 0.40–1.50)
GFR: 88.43 mL/min (ref >60.00)
Glucose, Bld: 101 mg/dL — ABNORMAL HIGH (ref 70–99)
Potassium: 3.8 mEq/L (ref 3.5–5.1)
Sodium: 139 mEq/L (ref 135–145)
Total Bilirubin: 0.4 mg/dL (ref 0.2–1.2)
Total Protein: 7.5 g/dL (ref 6.0–8.3)

## 2019-10-05 LAB — LIPID PANEL
Cholesterol: 152 mg/dL (ref 0–200)
HDL: 39.4 mg/dL (ref >39.00)
LDL Cholesterol: 103 mg/dL — ABNORMAL HIGH (ref 0–99)
NonHDL: 112.59
Total CHOL/HDL Ratio: 4
Triglycerides: 50 mg/dL (ref 0.0–149.0)
VLDL: 10 mg/dL (ref 0.0–40.0)

## 2019-10-05 LAB — PSA, MEDICARE: PSA: 0.76 ng/ml (ref 0.10–4.00)

## 2019-10-05 MED ORDER — TRIAMCINOLONE ACETONIDE 0.1 % EX CREA: 1.0000 "application " | TOPICAL_CREAM | Freq: Two times a day (BID) | CUTANEOUS | 0 refills | Status: DC

## 2019-10-05 NOTE — Assessment & Plan Note (Addendum)
States his did not get call from GI to schedule appt. No change in GI function, no melena or hematochezia, no weight loss. Entered another referral to GI and also provided him with GI office number.

## 2019-10-05 NOTE — Progress Notes (Signed)
Subjective:    Patient ID: Ronald Lara, male    DOB: [birth year], 69 y.o.   MRN: [id number]  Patient presents today for CPE and eval of rash.  Rash This is a chronic problem. The current episode started more than 1 month ago. The problem is unchanged. The affected locations include the abdomen. The rash is characterized by itchiness and dryness. Associated with: reaction to metal on his belt. Pertinent negatives include no anorexia, congestion, cough, diarrhea, facial edema, fatigue, fever, joint pain, shortness of breath or sore throat. Past treatments include nothing. There is no history of allergies or eczema.   Sexual History (orientation,birth control, marital status, STD):single, not sexually active, denies need for STD screen  Depression/Suicide: Depression screen [facility] 2/9 10/05/2019 10/05/2019 02/27/2018  Decreased Interest 0 0 0  Down, Depressed, Hopeless 0 0 0  PHQ - 2 Score 0 0 0   Vision:denies need  Dental:not scheduled  Immunizations: (TDAP, Hep C screen, Pneumovax, Influenza, zoster)  Health Maintenance  Topic Date Due  . Colon Cancer Screening  Never done  . Flu Shot  09/23/2019  . COVID-19 Vaccine (1) 10/21/2019*  . Tetanus Vaccine  10/04/2020*  .  Hepatitis C: One time screening is recommended by Center for Disease Control  (CDC) for  adults born from [birth year] through 1966.   Completed  . Pneumonia vaccines  Completed  *Topic was postponed. The date shown is not the original due date.   Diet:regular Weight:  Wt Readings from Last 3 Encounters:  10/05/19 197 lb 6.4 oz (89.5 kg)  08/14/19 208 lb (94.3 kg)  02/27/18 205 lb (93 kg)    Fall Risk: Fall Risk  10/05/2019 10/05/2019 02/27/2018  Falls in the past year? 0 0 0   Advanced Directive: Advanced Directives 02/27/2018  Does Patient Have a Medical Advance Directive? No  Would patient like information on creating a medical advance directive? No - Patient declined    Medications and allergies reviewed with patient  and updated if appropriate.  Patient Active Problem List   Diagnosis Date Noted  . Positive colorectal cancer screening using Cologuard test 10/05/2019  . 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   No current facility-administered medications on file prior to visit.   No past medical history on file.  No past surgical history on file.  Social History   Socioeconomic History  . Marital status: Single    Spouse name: Not on file  . Number of children: Not on file  . Years of education: Not on file  . Highest education level: 11th grade  Occupational History  . Occupation: disability  Tobacco Use  . Smoking status: Never Smoker  . Smokeless tobacco: Never Used  Vaping Use  . Vaping Use: Never used  Substance and Sexual Activity  . Alcohol use: No  . Drug use: No  . Sexual activity: Not Currently  Other Topics Concern  . Not on file  Social History Narrative  . Not on file   Social Determinants of Health   Financial Resource Strain:   . Difficulty of Paying Living Expenses:   Food Insecurity: No Food Insecurity  . Worried About Programme researcher, broadcasting/film/video in the Last Year: Never true  . Ran Out of Food in the Last Year: Never true  Transportation Needs: No Transportation Needs  . Lack of Transportation (Medical): No  . Lack of Transportation (Non-Medical): No  Physical Activity:   . Days of  Exercise per Week:   . Minutes of Exercise per Session:   Stress:   . Feeling of Stress :   Social Connections:   . Frequency of Communication with Friends and Family:   . Frequency of Social Gatherings with Friends and Family:   . Attends Religious Services:   . Active Member of Clubs or Organizations:   . Attends Banker Meetings:   Marland Kitchen Marital Status:     Family History  Problem Relation Age of Onset  . Diabetes Mother   . Heart disease Brother        Review of Systems  Constitutional: Negative for fatigue,  fever, malaise/fatigue and weight loss.  HENT: Negative for congestion and sore throat.   Eyes:       Negative for visual changes  Respiratory: Negative for cough and shortness of breath.   Cardiovascular: Negative for chest pain, palpitations and leg swelling.  Gastrointestinal: Negative for anorexia, blood in stool, constipation, diarrhea and heartburn.  Genitourinary: Negative for dysuria, frequency and urgency.  Musculoskeletal: Negative for falls, joint pain and myalgias.  Skin: Positive for rash.  Neurological: Negative for dizziness, sensory change and headaches.  Endo/Heme/Allergies: Does not bruise/bleed easily.  Psychiatric/Behavioral: Negative for depression, substance abuse and suicidal ideas. The patient is not nervous/anxious.    Objective:   Vitals:   10/05/19 0901  BP: 128/80  Pulse: (!) 58  Temp: (!) 97.5 F (36.4 C)  SpO2: 98%    Body mass index is 31.86 kg/m.  Physical Examination:  Physical Exam Vitals reviewed.  Constitutional:      General: He is not in acute distress.    Appearance: He is well-developed.  HENT:     Right Ear: Tympanic membrane, ear canal and external ear normal.     Left Ear: Tympanic membrane, ear canal and external ear normal.  Eyes:     Extraocular Movements: Extraocular movements intact.     Conjunctiva/sclera: Conjunctivae normal.  Cardiovascular:     Rate and Rhythm: Normal rate and regular rhythm.     Pulses: Normal pulses.     Heart sounds: Normal heart sounds.  Pulmonary:     Effort: Pulmonary effort is normal. No respiratory distress.     Breath sounds: Normal breath sounds.  Chest:     Chest wall: No tenderness.  Abdominal:     General: Bowel sounds are normal.     Palpations: Abdomen is soft.  Genitourinary:    Comments: declined Musculoskeletal:        General: Normal range of motion.     Cervical back: Normal range of motion and neck supple.  Skin:    General: Skin is warm and dry.     Findings: Rash  present. No erythema. Rash is macular.       Neurological:     Mental Status: He is alert and oriented to person, place, and time.     Deep Tendon Reflexes: Reflexes are normal and symmetric.  Psychiatric:        Mood and Affect: Mood normal.        Behavior: Behavior normal.        Thought Content: Thought content normal.    ASSESSMENT and PLAN: This visit occurred during the SARS-CoV-2 public health emergency.  Safety protocols were in place, including screening questions prior to the visit, additional usage of staff PPE, and extensive cleaning of exam room while observing appropriate contact time as indicated for disinfecting solutions.   Ronald Lara was seen  today for annual exam.  Diagnoses and all orders for this visit:  Preventative health care -     Comprehensive metabolic panel -     PSA, Medicare  Positive colorectal cancer screening using Cologuard test -     Ambulatory referral to Gastroenterology  Encounter for lipid screening for cardiovascular disease -     Lipid panel  Prostate cancer screening -     PSA, Medicare  Allergic dermatitis -     triamcinolone cream (KENALOG) 0.1 %; Apply 1 application topically 2 (two) times daily.  I spent explaining the importance of COVID vaccine and possible side effects. I also provide printed information on different COVID available. I provided number to call and schedule for COVID vaccine.    Problem List Items Addressed This Visit      Other   Positive colorectal cancer screening using Cologuard test    States his did not get call from GI to schedule appt. No change in GI function, no melena or hematochezia, no weight loss. Entered another referral to GI and also provided him with GI office number.      Relevant Orders   Ambulatory referral to Gastroenterology    Other Visit Diagnoses    Preventative health care    -  Primary   Relevant Orders   Comprehensive metabolic panel   PSA, Medicare   Encounter for  lipid screening for cardiovascular disease       Relevant Orders   Lipid panel   Prostate cancer screening       Relevant Orders   PSA, Medicare   Allergic dermatitis       Relevant Medications   triamcinolone cream (KENALOG) 0.1 %      Follow up: Return in about 1 year (around 10/04/2020).  Alysia Penna, NP

## 2019-10-05 NOTE — Patient Instructions (Addendum)
Go to lab for blood draw  You will be contacted to schedule appt with GI: [phone].  Schedule annual eye exam with ophthalmology  Schedule dental cleaning every 40month  I strongly recommend you get COVID vaccine.  COVID-19 Vaccine Information can be found at: [email or web address] questions related to vaccine distribution or appointments, please email [email or web address] or call [phone]   Preventive Care 70Years and Older, Male Preventive care refers to lifestyle choices and visits with your health care provider that can promote health and wellness. This includes:  A yearly physical exam. This is also called an annual well check.  Regular dental and eye exams.  Immunizations.  Screening for certain conditions.  Healthy lifestyle choices, such as diet and exercise. What can I expect for my preventive care visit? Physical exam Your health care provider will check:  Height and weight. These may be used to calculate body mass index (BMI), which is a measurement that tells if you are at a healthy weight.  Heart rate and blood pressure.  Your skin for abnormal spots. Counseling Your health care provider may ask you questions about:  Alcohol, tobacco, and drug use.  Emotional well-being.  Home and relationship well-being.  Sexual activity.  Eating habits.  History of falls.  Memory and ability to understand (cognition).  Work and work eStatistician What immunizations do I need?  Influenza (flu) vaccine  This is recommended every year. Tetanus, diphtheria, and pertussis (Tdap) vaccine  You may need a Td booster every 10 years. Varicella (chickenpox) vaccine  You may need this vaccine if you have not already been vaccinated. Zoster (shingles) vaccine  You may need this after age 708253 Pneumococcal conjugate (PCV13) vaccine  One dose is recommended after age 708256 Pneumococcal  polysaccharide (PPSV23) vaccine  One dose is recommended after age 708253 Measles, mumps, and rubella (MMR) vaccine  You may need at least one dose of MMR if you were born in 1957 or later. You may also need a second dose. Meningococcal conjugate (MenACWY) vaccine  You may need this if you have certain conditions. Hepatitis A vaccine  You may need this if you have certain conditions or if you travel or work in places where you may be exposed to hepatitis A. Hepatitis B vaccine  You may need this if you have certain conditions or if you travel or work in places where you may be exposed to hepatitis B. Haemophilus influenzae type b (Hib) vaccine  You may need this if you have certain conditions. You may receive vaccines as individual doses or as more than one vaccine together in one shot (combination vaccines). Talk with your health care provider about the risks and benefits of combination vaccines. What tests do I need? Blood tests  Lipid and cholesterol levels. These may be checked every 5 years, or more frequently depending on your overall health.  Hepatitis C test.  Hepatitis B test. Screening  Lung cancer screening. You may have this screening every year starting at age 7073if you have a 30-pack-year history of smoking and currently smoke or have quit within the past 15 years.  Colorectal cancer screening. All adults should have this screening starting at age 7069and continuing until age 70 Your health care provider may recommend screening at age 3076if you are at increased risk. You will have tests every 1-10 years, depending on your results and the type of screening test.  Prostate cancer screening. Recommendations will vary depending on your family history and  other risks.  Diabetes screening. This is done by checking your blood sugar (glucose) after you have not eaten for a while (fasting). You may have this done every 1-3 years.  Abdominal aortic aneurysm (AAA) screening. You  may need this if you are a current or former smoker.  Sexually transmitted disease (STD) testing. Follow these instructions at home: Eating and drinking  Eat a diet that includes fresh fruits and vegetables, whole grains, lean protein, and low-fat dairy products. Limit your intake of foods with high amounts of sugar, saturated fats, and salt.  Take vitamin and mineral supplements as recommended by your health care provider.  Do not drink alcohol if your health care provider tells you not to drink.  If you drink alcohol: ? Limit how much you have to 0-2 drinks a day. ? Be aware of how much alcohol is in your drink. In the [place] one drink equals one 12 oz bottle of beer (355 mL), one 5 oz glass of wine (148 mL), or one 1 oz glass of hard liquor (44 mL). Lifestyle  Take daily care of your teeth and gums.  Stay active. Exercise for at least 30 minutes on 5 or more days each week.  Do not use any products that contain nicotine or tobacco, such as cigarettes, e-cigarettes, and chewing tobacco. If you need help quitting, ask your health care provider.  If you are sexually active, practice safe sex. Use a condom or other form of protection to prevent STIs (sexually transmitted infections).  Talk with your health care provider about taking a low-dose aspirin or statin. What's next?  Visit your health care provider once a year for a well check visit.  Ask your health care provider how often you should have your eyes and teeth checked.  Stay up to date on all vaccines. This information is not intended to replace advice given to you by your health care provider. Make sure you discuss any questions you have with your health care provider. Document Revised: 02/02/2018 Document Reviewed: 02/02/2018 Elsevier Patient Education  2020 [employer].

## 2019-10-17 ENCOUNTER — Other Ambulatory Visit: Payer: Self-pay | Admitting: Nurse Practitioner

## 2019-10-17 DIAGNOSIS — L239 Allergic contact dermatitis, unspecified cause: Secondary | ICD-10-CM

## 2019-10-17 NOTE — Telephone Encounter (Signed)
Medication refill request  10/05/19 Last OV  10/05/19 Last refill date // 30 g // 0 refills  Forwarding to PCP for review.

## 2019-11-12 ENCOUNTER — Telehealth: Payer: Self-pay | Admitting: Nurse Practitioner

## 2019-11-12 DIAGNOSIS — L239 Allergic contact dermatitis, unspecified cause: Secondary | ICD-10-CM

## 2019-11-12 NOTE — Telephone Encounter (Signed)
Patient is calling to get a refill on his Kenalog. He states that the pharmacy said that it also comes in a jar so he would like to get it instead of the tube. Please give him a call back at [phone] if you have any questions.

## 2019-11-12 NOTE — Telephone Encounter (Signed)
I do not recommend a larger quantity. This is because, he is to use ointment for no more than 2weeks at a time. Ask if rash is improving?

## 2019-11-12 NOTE — Telephone Encounter (Signed)
Pt states rash is improving and states that the pharmacy recommended a jar so that is why he suggested it. Pt states the color is clearing up and is getting lighter but it has not went away.

## 2019-11-13 MED ORDER — CLOTRIMAZOLE-BETAMETHASONE 1-0.05 % EX CREA: 1.0000 "application " | TOPICAL_CREAM | Freq: Two times a day (BID) | CUTANEOUS | 0 refills | Status: DC

## 2019-11-13 NOTE — Telephone Encounter (Signed)
Patient notified and verbalized understanding. 

## 2019-11-13 NOTE — Telephone Encounter (Signed)
I changed cream to lotrisone. He is to use for 1week then stop. If no improvement, he will be eval by dermatologist.

## 2019-12-01 ENCOUNTER — Other Ambulatory Visit: Payer: Self-pay | Admitting: Nurse Practitioner

## 2019-12-01 DIAGNOSIS — L239 Allergic contact dermatitis, unspecified cause: Secondary | ICD-10-CM

## 2019-12-06 ENCOUNTER — Other Ambulatory Visit: Payer: Self-pay | Admitting: Nurse Practitioner

## 2019-12-06 ENCOUNTER — Telehealth: Payer: Self-pay | Admitting: Nurse Practitioner

## 2019-12-06 DIAGNOSIS — L239 Allergic contact dermatitis, unspecified cause: Secondary | ICD-10-CM

## 2019-12-06 NOTE — Telephone Encounter (Signed)
Patient is calling and requesting a refill for clotrimazole- betamethasone sent to [facility] on [address] [facility], please advise. CB is [phone]

## 2019-12-12 NOTE — Telephone Encounter (Signed)
LVM for patient to return call regarding medication refill. Pt was informed at last ov if symptoms did not improve he would need a referral to dermatology. Need to confirm if symptoms are still present and how severe.

## 2019-12-13 ENCOUNTER — Other Ambulatory Visit: Payer: Self-pay | Admitting: Nurse Practitioner

## 2019-12-13 DIAGNOSIS — L239 Allergic contact dermatitis, unspecified cause: Secondary | ICD-10-CM

## 2019-12-13 NOTE — Telephone Encounter (Signed)
Patient returned a call to the nurse. Please give him a call back.

## 2019-12-31 ENCOUNTER — Telehealth: Payer: Self-pay | Admitting: Nurse Practitioner

## 2019-12-31 NOTE — Telephone Encounter (Signed)
Patient mailbox is FULL,   Need to schedule Annual Wellness Visit.  Please schedule with Nurse Health Advisor Thomasenia Sales, RN at [facility]

## 2020-01-02 ENCOUNTER — Telehealth: Payer: Self-pay | Admitting: Nurse Practitioner

## 2020-01-02 DIAGNOSIS — L239 Allergic contact dermatitis, unspecified cause: Secondary | ICD-10-CM

## 2020-01-02 NOTE — Telephone Encounter (Signed)
Unable to leave VM due to mailbox is full.  Provider denied a refill last month on this medication and stated that if still having issues then needed a dermatology referral. Will try later. Dm/cma

## 2020-01-02 NOTE — Telephone Encounter (Signed)
Patient is calling to get a refill on his Clotrimazole, If approved, please send to [facility] on [address] and call him back at [phone] to let him know that it was sent in. He also mentioned that it came in a 6oz jar.

## 2020-01-09 NOTE — Telephone Encounter (Signed)
Patient states that he would like to move forward with the Dermatology referral.

## 2020-01-09 NOTE — Addendum Note (Signed)
Addended by: Elyn Peers on: 01/09/2020 04:41 PM   Modules accepted: Orders

## 2020-01-25 ENCOUNTER — Other Ambulatory Visit: Payer: Self-pay | Admitting: Nurse Practitioner

## 2020-01-25 DIAGNOSIS — L239 Allergic contact dermatitis, unspecified cause: Secondary | ICD-10-CM

## 2020-07-29 ENCOUNTER — Telehealth: Payer: Self-pay | Admitting: Nurse Practitioner

## 2020-07-29 NOTE — Telephone Encounter (Signed)
Left message for patient to call back and schedule Medicare Annual Wellness Visit (AWV).   Please offer to do virtually or by telephone.   Due for AWVI  Please schedule at anytime with Nurse Health Advisor.   

## 2020-09-24 ENCOUNTER — Telehealth: Payer: Self-pay | Admitting: Nurse Practitioner

## 2020-09-24 NOTE — Telephone Encounter (Signed)
Pt come in office with copy of document that [facility] ordered cologuard 08/09/2019. He did not have results but states it was 92% and normal. In front folder for provider.

## 2020-11-08 ENCOUNTER — Telehealth: Payer: Self-pay

## 2020-11-08 NOTE — Telephone Encounter (Signed)
Lft VM to rtn call to schedule an AWV.  Dm/cma  

## 2020-11-12 ENCOUNTER — Telehealth: Payer: Self-pay | Admitting: Nurse Practitioner

## 2020-11-12 NOTE — Telephone Encounter (Signed)
Left message for patient to call back and schedule Medicare Annual Wellness Visit (AWV). Please offer to do virtually or by telephone.  Left office number and my jabber #[phone]. ? ?Due for AWVI ? ?Please schedule at anytime with Nurse Health Advisor. ?  ?

## 2020-11-25 ENCOUNTER — Ambulatory Visit (INDEPENDENT_AMBULATORY_CARE_PROVIDER_SITE_OTHER): Payer: Medicare Other | Admitting: Nurse Practitioner

## 2020-11-25 ENCOUNTER — Encounter: Payer: Self-pay | Admitting: Nurse Practitioner

## 2020-11-25 ENCOUNTER — Other Ambulatory Visit: Payer: Self-pay

## 2020-11-25 VITALS — BP 132/68 | HR 62 | Temp 97.6°F | Ht 67.0 in | Wt 198.2 lb

## 2020-11-25 DIAGNOSIS — Z125 Encounter for screening for malignant neoplasm of prostate: Secondary | ICD-10-CM

## 2020-11-25 DIAGNOSIS — Z23 Encounter for immunization: Secondary | ICD-10-CM | POA: Diagnosis not present

## 2020-11-25 DIAGNOSIS — Z136 Encounter for screening for cardiovascular disorders: Secondary | ICD-10-CM

## 2020-11-25 DIAGNOSIS — R195 Other fecal abnormalities: Secondary | ICD-10-CM | POA: Diagnosis not present

## 2020-11-25 DIAGNOSIS — I498 Other specified cardiac arrhythmias: Secondary | ICD-10-CM | POA: Diagnosis not present

## 2020-11-25 DIAGNOSIS — R739 Hyperglycemia, unspecified: Secondary | ICD-10-CM

## 2020-11-25 DIAGNOSIS — Z13220 Encounter for screening for lipoid disorders: Secondary | ICD-10-CM | POA: Diagnosis not present

## 2020-11-25 DIAGNOSIS — Z0001 Encounter for general adult medical examination with abnormal findings: Secondary | ICD-10-CM

## 2020-11-25 LAB — COMPREHENSIVE METABOLIC PANEL
ALT: 18 U/L (ref 0–53)
AST: 23 U/L (ref 0–37)
Albumin: 4.5 g/dL (ref 3.5–5.2)
Alkaline Phosphatase: 48 U/L (ref 39–117)
BUN: 16 mg/dL (ref 6–23)
CO2: 29 mEq/L (ref 19–32)
Calcium: 9.8 mg/dL (ref 8.4–10.5)
Chloride: 103 mEq/L (ref 96–112)
Creatinine, Ser: 1.01 mg/dL (ref 0.40–1.50)
GFR: 75.15 mL/min (ref >60.00)
Glucose, Bld: 90 mg/dL (ref 70–99)
Potassium: 4.1 mEq/L (ref 3.5–5.1)
Sodium: 141 mEq/L (ref 135–145)
Total Bilirubin: 0.3 mg/dL (ref 0.2–1.2)
Total Protein: 7.8 g/dL (ref 6.0–8.3)

## 2020-11-25 LAB — CBC
HCT: 40.8 % (ref 39.0–52.0)
Hemoglobin: 13.7 g/dL (ref 13.0–17.0)
MCHC: 33.5 g/dL (ref 30.0–36.0)
MCV: 90 fl (ref 78.0–100.0)
Platelets: 203 10*3/uL (ref 150.0–400.0)
RBC: 4.54 Mil/uL (ref 4.22–5.81)
RDW: 15.1 % (ref 11.5–15.5)
WBC: 5 10*3/uL (ref 4.0–10.5)

## 2020-11-25 LAB — LIPID PANEL
Cholesterol: 169 mg/dL (ref 0–200)
HDL: 42.6 mg/dL (ref >39.00)
LDL Cholesterol: 107 mg/dL — ABNORMAL HIGH (ref 0–99)
NonHDL: 126.49
Total CHOL/HDL Ratio: 4
Triglycerides: 98 mg/dL (ref 0.0–149.0)
VLDL: 19.6 mg/dL (ref 0.0–40.0)

## 2020-11-25 LAB — PSA, MEDICARE: PSA: 0.82 ng/ml (ref 0.10–4.00)

## 2020-11-25 LAB — HEMOGLOBIN A1C: Hgb A1c MFr Bld: 6.1 % (ref 4.6–6.5)

## 2020-11-25 LAB — TSH: TSH: 2.65 u[IU]/mL (ref 0.35–5.50)

## 2020-11-25 NOTE — Patient Instructions (Addendum)
Schedule appt with GI for colonoscopy: [phone]  Schedule appt with [facility] for year eye exam. [phone]  Go to lab for blood draw.  ECG: Sinus arrhythmia, no additional test needed at this time.  Colorectal Cancer Screening Colorectal cancer screening is a group of tests that are used to check for colorectal cancer before symptoms develop. Colorectal refers to the colon and rectum. The colon and rectum are located at the end of the digestive tract and carry stool (feces) out of the body. Who should have screening? All adults who are 7-33 years old should have screening. Your health care provider may recommend screening before age 14. You will have tests every 1-10 years, depending on your results and the type of screening test. Screening recommendations for adults who are 39-57 years old vary depending on a person's health. People older than age 29 should no longer get colorectal cancer screening. You may have screening tests starting before age 37, or more often than other people, if you have any of these risk factors: A personal or family history of colorectal cancer or abnormal growths known as polyps in your colon. Inflammatory bowel disease, such as ulcerative colitis or Crohn's disease. A history of having radiation treatment to the abdomen or the area between the hip bones (pelvic area) for cancer. A type of genetic syndrome that is passed from parent to child (hereditary), such as: Lynch syndrome. Familial adenomatous polyposis. Turcot syndrome. Peutz-Jeghers syndrome. MUTYH-associated polyposis (MAP). A personal history of diabetes. Types of tests There are several types of colorectal screening tests. You may have one or more of the following: Guaiac-based fecal occult blood testing. For this test, a stool sample is checked for hidden (occult) blood, which could be a sign of colorectal cancer. Fecal immunochemical test (FIT). For this test, a stool sample is checked for  blood, which could be a sign of colorectal cancer. Stool DNA test. For this test, a stool sample is checked for blood and changes in DNA that could lead to colorectal cancer. Sigmoidoscopy. During this test, a thin, flexible tube with a camera on the end, called a sigmoidoscope, is used to examine the rectum and the lower colon. Colonoscopy. During this test, a long, flexible tube with a camera on the end, called a colonoscope, is used to examine the entire colon and rectum. Also, sometimes a tissue sample is taken to be looked at under a microscope (biopsy) or small polyps are removed during this test. Virtual colonoscopy. Instead of a colonoscope, this type of colonoscopy uses a CT scan to take pictures of the colon and rectum. A CT scan is a type of X-ray that is made using computers. What are the benefits of screening? Screening reduces your risk for colorectal cancer and can help identify cancer at an early stage, when the cancer can be removed or treated more easily. It is common for polyps to form in the lining of the colon, especially as you age. These polyps may be cancerous or become cancerous over time. Screening can identify these polyps. What are the risks of screening? Generally, these are safe tests. However, problems may occur, including: The need for more tests to confirm results from a stool sample test. Stool sample tests have fewer risks than other types of screening tests. Being exposed to low levels of radiation, if you had a test involving X-rays. This may slightly increase your cancer risk. The benefit of detecting cancer outweighs the slight increase in risk. Bleeding,  damage to the intestine, or infection caused by a sigmoidoscopy or colonoscopy. A reaction to medicines given during a sigmoidoscopy or colonoscopy. Talk with your health care provider to understand your risk for colorectal cancer and to make a screening plan that is right for you. Questions to ask your health  care provider When should I start colorectal cancer screening? What is my risk for colorectal cancer? How often do I need screening? Which screening tests do I need? How do I get my test results? What do my results mean? Where to find more information Learn more about colorectal cancer screening from: The American Cancer Society: cancer.org National Cancer Institute: cancer.gov Summary Colorectal cancer screening is a group of tests used to check for colorectal cancer before symptoms develop. All adults who are 39-46 years old should have screening. Your health care provider may recommend screening before age 10. You may have screening tests starting before age 79, or more often than other people, if you have certain risk factors. Screening reduces your risk for colorectal cancer and can help identify cancer at an early stage, when the cancer can be removed or treated more easily. Talk with your health care provider to understand your risk for colorectal cancer and to make a screening plan that is right for you. This information is not intended to replace advice given to you by your health care provider. Make sure you discuss any questions you have with your health care provider. Document Revised: 05/30/2019 Document Reviewed: 05/30/2019 Elsevier Patient Education  2022 [employer].

## 2020-11-25 NOTE — Progress Notes (Signed)
Subjective:    Patient ID: Ronald Lara, male    DOB: 08/08/1950, 70 y.o.   MRN: [id number]  Patient presents today for annual wellness visit, and CPE  HPI Here for medicare wellness, no new complaints. Please see A/P for status and treatment of chronic medical problems.   Diet: regular, denies any food insecurities Transportation: own private vehicle and able to drive Physical activity: active, walking daily Depression/mood screen: negative Hearing: intact to whispered voice Visual acuity: grossly normal, will schedule annual eye exam with [facility]:declined to schedule appt ADLs: capable Fall risk: none Home safety: good Cognitive evaluation: intact to orientation, naming, recall and repetition EOL planning: adv directives discussed, declined to complete MOST form  I have personally reviewed and have noted 1. The patient's medical and social history - reviewed today no changes 2. Their use of alcohol, tobacco or illicit drugs 3. Their current medications and supplements 4. The patient's functional ability including ADL's, fall risks, home safety risks and hearing or visual impairment. 5. Diet and physical activities 6. Evidence for depression or mood disorders 7. Care team reviewed and updated (available in snapshot)   Weight:  Wt Readings from Last 3 Encounters:  11/25/20 198 lb 3.2 oz (89.9 kg)  10/05/19 197 lb 6.4 oz (89.5 kg)  08/14/19 208 lb (94.3 kg)    Depression/Suicide: Depression screen [facility] 2/9 11/25/2020 10/05/2019 10/05/2019 02/27/2018  Decreased Interest 0 0 0 0  Down, Depressed, Hopeless 0 0 0 0  PHQ - 2 Score 0 0 0 0   MMSE - Mini Mental State Exam 11/25/2020  Orientation to time 5  Orientation to Place 5  Registration 3  Attention/ Calculation 5  Recall 3  Language- name 2 objects 2  Language- repeat 1  Language- follow 3 step command 3  Language- read & follow direction 1  Write a sentence 1  Copy design 1  Total score 30    Immunizations: (TDAP, Hep C screen, Pneumovax, Influenza, zoster): Advised to schedule appt for colon cancer screen. Provided GI telephone number Health Maintenance  Topic Date Due   COVID-19 Vaccine (1) Never done   Tetanus Vaccine  Never done   Colon Cancer Screening  Never done   Zoster (Shingles) Vaccine (1 of 2) Never done   Flu Shot  Completed   Hepatitis C Screening: USPSTF Recommendation to screen - Ages 23-79 yo.  Completed   HPV Vaccine  Aged Out   Fall Risk: Fall Risk  11/25/2020 10/05/2019 10/05/2019 02/27/2018  Falls in the past year? 0 0 0 0  Number falls in past yr: 0 - - -  Injury with Fall? 0 - - -  Risk for fall due to : No Fall Risks - - -  Follow up Falls evaluation completed - - -   Advanced Directive: Advanced Directives 11/25/2020  Does Patient Have a Medical Advance Directive? No  Would patient like information on creating a medical advance directive? No - Patient declined    SDOH Screenings   Alcohol Screen: Not on file  Depression (PHQ2-9): Low Risk    PHQ-2 Score: 0  Financial Resource Strain: Medium Risk   Difficulty of Paying Living Expenses: Somewhat hard  Food Insecurity: Not on file  Housing: Low Risk    Last Housing Risk Score: 0  Physical Activity: Insufficiently Active   Days of Exercise per Week: 7 days   Minutes of Exercise per Session: 20 min  Social Connections: Socially Isolated   Frequency of Communication  with Friends and Family: Twice a week   Frequency of Social Gatherings with Friends and Family: Never   Attends Religious Services: Never   Database administrator or Organizations: No   Attends Engineer, structural: Never   Marital Status: Never married  Stress: Not on file  Tobacco Use: Low Risk    Smoking Tobacco Use: Never   Smokeless Tobacco Use: Never  Transportation Needs: No Transportation Needs   Lack of Transportation (Medical): No   Lack of Transportation (Non-Medical): No    Medications and allergies  reviewed with patient and updated if appropriate.  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    History reviewed. No pertinent past medical history.  History reviewed. No pertinent surgical history.  Social History   Socioeconomic History   Marital status: Single    Spouse name: Not on file   Number of children: Not on file   Years of education: Not on file   Highest education level: 11th grade  Occupational History   Occupation: disability  Tobacco Use   Smoking status: Never   Smokeless tobacco: Never  Vaping Use   Vaping Use: Never used  Substance and Sexual Activity   Alcohol use: No   Drug use: No   Sexual activity: Not Currently  Other Topics Concern   Not on file  Social History Narrative   Not on file   Social Determinants of Health   Financial Resource Strain: Medium Risk   Difficulty of Paying Living Expenses: Somewhat hard  Food Insecurity: Not on file  Transportation Needs: No Transportation Needs   Lack of Transportation (Medical): No   Lack of Transportation (Non-Medical): No  Physical Activity: Insufficiently Active   Days of Exercise per Week: 7 days   Minutes of Exercise per Session: 20 min  Stress: Not on file  Social Connections: Socially Isolated   Frequency of Communication with Friends and Family: Twice a week   Frequency of Social Gatherings with Friends and Family: Never   Attends Religious Services: Never   Database administrator or Organizations: No   Attends Engineer, structural: Never   Marital  Status: Never married    Family History  Problem Relation Age of Onset   Diabetes Mother    Heart disease Brother         ROS  Objective:   Vitals:   11/25/20 1340  BP: 132/68  Pulse: 62  Temp: 97.6 F (36.4 C)  SpO2: 98%    Body mass index is 31.04 kg/m.   Physical Examination:  Physical Exam Vitals reviewed.  Constitutional:      General: He is not in acute distress.    Appearance: He is well-developed. He is obese.  HENT:     Right Ear: Tympanic membrane, ear canal and external ear normal.     Left Ear: Tympanic membrane, ear canal and external ear normal.  Eyes:     Extraocular Movements: Extraocular movements intact.     Conjunctiva/sclera: Conjunctivae normal.  Cardiovascular:     Rate and Rhythm: Normal rate. Rhythm irregular.  Pulses: Normal pulses.     Heart sounds: Normal heart sounds.  Pulmonary:     Effort: Pulmonary effort is normal. No respiratory distress.     Breath sounds: Normal breath sounds.  Chest:     Chest wall: No tenderness.  Abdominal:     General: Bowel sounds are normal.     Palpations: Abdomen is soft.  Musculoskeletal:        General: Normal range of motion.     Cervical back: Normal range of motion and neck supple.     Right lower leg: No edema.     Left lower leg: No edema.  Skin:    General: Skin is warm and dry.  Neurological:     Mental Status: He is alert and oriented to person, place, and time.     Deep Tendon Reflexes: Reflexes are normal and symmetric.  Psychiatric:        Mood and Affect: Mood normal.        Behavior: Behavior normal.    ASSESSMENT and PLAN: This visit occurred during the SARS-CoV-2 public health emergency.  Safety protocols were in place, including screening questions prior to the visit, additional usage of staff PPE, and extensive cleaning of exam room while observing appropriate contact time as indicated for disinfecting solutions.   Osher was seen today for annual exam.  Diagnoses  and all orders for this visit:  Encounter for preventative adult health care exam with abnormal findings -     Comprehensive metabolic panel -     Lipid panel -     PSA, Medicare -     CBC  Positive colorectal cancer screening using Cologuard test  Hyperglycemia -     Hemoglobin A1c  Prostate cancer screening -     PSA, Medicare  Encounter for lipid screening for cardiovascular disease  Sinus arrhythmia -     TSH -     EKG 12-Lead  Flu vaccine need -     Flu Vaccine QUAD High Dose(Fluad) Schedule appt with GI for colonoscopy: [phone] Schedule appt with [facility] for year eye exam. [phone] ECG: Sinus arrhythmia, no change compared to 2018 ECG.    Problem List Items Addressed This Visit       Cardiovascular and Mediastinum   Sinus arrhythmia   Relevant Orders   TSH   EKG 12-Lead (Completed)     Other   Hyperglycemia   Relevant Orders   Hemoglobin A1c   Positive colorectal cancer screening using Cologuard test   Other Visit Diagnoses     Encounter for preventative adult health care exam with abnormal findings    -  Primary   Relevant Orders   Comprehensive metabolic panel   Lipid panel   PSA, Medicare   CBC   Prostate cancer screening       Relevant Orders   PSA, Medicare   Encounter for lipid screening for cardiovascular disease       Flu vaccine need       Relevant Orders   Flu Vaccine QUAD High Dose(Fluad) (Completed)       Follow up: Return in about 1 year (around 11/25/2021) for CPE (fasting).  Alysia Penna, NP

## 2020-12-03 ENCOUNTER — Telehealth: Payer: Self-pay | Admitting: Nurse Practitioner

## 2020-12-03 DIAGNOSIS — Z125 Encounter for screening for malignant neoplasm of prostate: Secondary | ICD-10-CM

## 2020-12-03 NOTE — Telephone Encounter (Signed)
Pt is wanting a cb concerning him getting a colonoscopy. Please advise at [phone].

## 2020-12-04 NOTE — Telephone Encounter (Signed)
Spoke with patient in office on 12/03/20

## 2020-12-05 NOTE — Telephone Encounter (Signed)
Pt called back and states GI informed him he needed a referral placed for colonoscopy. Referral entered.

## 2020-12-05 NOTE — Addendum Note (Signed)
Addended by: Elyn Peers on: 12/05/2020 11:26 AM   Modules accepted: Orders

## 2021-01-23 ENCOUNTER — Other Ambulatory Visit: Payer: Self-pay | Admitting: Nurse Practitioner

## 2021-01-23 DIAGNOSIS — W57XXXA Bitten or stung by nonvenomous insect and other nonvenomous arthropods, initial encounter: Secondary | ICD-10-CM

## 2021-11-16 ENCOUNTER — Telehealth: Payer: Self-pay | Admitting: Nurse Practitioner

## 2021-11-16 NOTE — Telephone Encounter (Signed)
Left message for patient to call back and schedule Medicare Annual Wellness Visit (AWV).   Please offer to do virtually or by telephone.  Left office number and my jabber #[phone].  Last AWV:11/25/2020  Please schedule at anytime with Nurse Health Advisor.   

## 2021-12-07 ENCOUNTER — Telehealth: Payer: Self-pay | Admitting: Nurse Practitioner

## 2021-12-07 NOTE — Telephone Encounter (Signed)
Left message for patient to call back and schedule Medicare Annual Wellness Visit (AWV).   Please offer to do virtually or by telephone.  Left office number and my jabber #[phone].  Last AWV:11/25/2020  Please schedule at anytime with Nurse Health Advisor.   

## 2022-01-04 ENCOUNTER — Telehealth: Payer: Self-pay | Admitting: Nurse Practitioner

## 2022-01-04 NOTE — Telephone Encounter (Signed)
Left message for patient to call back and schedule Medicare Annual Wellness Visit (AWV) in office.   If not able to come in office, please offer to do virtually or by telephone.  Left office number and my jabber #[phone].  Last AWV:11/25/2020   Please schedule at anytime with Nurse Health Advisor.  

## 2022-02-05 ENCOUNTER — Telehealth: Payer: Self-pay | Admitting: Nurse Practitioner

## 2022-02-05 NOTE — Telephone Encounter (Signed)
Left message for patient to call back and schedule Medicare Annual Wellness Visit (AWV) in office.   If not able to come in office, please offer to do virtually or by telephone.  Left office number and my jabber #[phone].  Last AWV:11/25/2020   Please schedule at anytime with Nurse Health Advisor.  

## 2022-03-08 ENCOUNTER — Telehealth: Payer: Self-pay | Admitting: Nurse Practitioner

## 2022-03-08 NOTE — Telephone Encounter (Signed)
Left message for patient to call back and schedule Medicare Annual Wellness Visit (AWV) in office.   If not able to come in office, please offer to do virtually or by telephone.  Left office number and my jabber [phone].  Last AWV:11/25/2020   Please schedule at anytime with Nurse Health Advisor.

## 2022-03-24 ENCOUNTER — Ambulatory Visit (INDEPENDENT_AMBULATORY_CARE_PROVIDER_SITE_OTHER): Payer: 59 | Admitting: Nurse Practitioner

## 2022-03-24 ENCOUNTER — Encounter: Payer: Self-pay | Admitting: Nurse Practitioner

## 2022-03-24 VITALS — BP 124/78 | HR 68 | Temp 98.2°F | Resp 16 | Ht 67.0 in | Wt 184.0 lb

## 2022-03-24 DIAGNOSIS — R195 Other fecal abnormalities: Secondary | ICD-10-CM | POA: Diagnosis not present

## 2022-03-24 DIAGNOSIS — Z125 Encounter for screening for malignant neoplasm of prostate: Secondary | ICD-10-CM | POA: Diagnosis not present

## 2022-03-24 DIAGNOSIS — Z13220 Encounter for screening for lipoid disorders: Secondary | ICD-10-CM

## 2022-03-24 DIAGNOSIS — Z136 Encounter for screening for cardiovascular disorders: Secondary | ICD-10-CM

## 2022-03-24 DIAGNOSIS — H579 Unspecified disorder of eye and adnexa: Secondary | ICD-10-CM

## 2022-03-24 DIAGNOSIS — Z0000 Encounter for general adult medical examination without abnormal findings: Secondary | ICD-10-CM

## 2022-03-24 DIAGNOSIS — R739 Hyperglycemia, unspecified: Secondary | ICD-10-CM

## 2022-03-24 DIAGNOSIS — Z0001 Encounter for general adult medical examination with abnormal findings: Secondary | ICD-10-CM

## 2022-03-24 LAB — LIPID PANEL
Cholesterol: 197 mg/dL (ref 0–200)
HDL: 52.7 mg/dL (ref >39.00)
LDL Cholesterol: 107 mg/dL — ABNORMAL HIGH (ref 0–99)
NonHDL: 144.6
Total CHOL/HDL Ratio: 4
Triglycerides: 188 mg/dL — ABNORMAL HIGH (ref 0.0–149.0)
VLDL: 37.6 mg/dL (ref 0.0–40.0)

## 2022-03-24 LAB — COMPREHENSIVE METABOLIC PANEL
ALT: 59 U/L — ABNORMAL HIGH (ref 0–53)
AST: 35 U/L (ref 0–37)
Albumin: 4.2 g/dL (ref 3.5–5.2)
Alkaline Phosphatase: 44 U/L (ref 39–117)
BUN: 15 mg/dL (ref 6–23)
CO2: 28 mEq/L (ref 19–32)
Calcium: 9 mg/dL (ref 8.4–10.5)
Chloride: 103 mEq/L (ref 96–112)
Creatinine, Ser: 0.89 mg/dL (ref 0.40–1.50)
GFR: 85.79 mL/min (ref >60.00)
Glucose, Bld: 97 mg/dL (ref 70–99)
Potassium: 4.1 mEq/L (ref 3.5–5.1)
Sodium: 139 mEq/L (ref 135–145)
Total Bilirubin: 0.3 mg/dL (ref 0.2–1.2)
Total Protein: 7.4 g/dL (ref 6.0–8.3)

## 2022-03-24 LAB — HEMOGLOBIN A1C: Hgb A1c MFr Bld: 6.1 % (ref 4.6–6.5)

## 2022-03-24 LAB — PSA, MEDICARE: PSA: 0.59 ng/ml (ref 0.10–4.00)

## 2022-03-24 NOTE — Assessment & Plan Note (Signed)
Entered ophthalmology referral

## 2022-03-24 NOTE — Assessment & Plan Note (Signed)
Agreed to to GI referral

## 2022-03-24 NOTE — Patient Instructions (Addendum)
Go to lab  Ronald Lara , Thank you for taking time to come for your Medicare Wellness Visit. I appreciate your ongoing commitment to your health goals. Please review the following plan we discussed and let me know if I can assist you in the future.   These are the goals we discussed: Schedule appt with GI for colon cancer screen- [phone] Schedule appt with ophthalmology due to poor vision and Need to screen for glaucoma and cataract.  This is a list of the screening recommended for you and due dates:  Health Maintenance  Topic Date Due   Medicare Annual Wellness Visit  11/25/2021   COVID-19 Vaccine (1) 04/09/2022*   Flu Shot  05/23/2022*   Colon Cancer Screening  03/25/2023*   Zoster (Shingles) Vaccine (1 of 2) 03/25/2023*   Pneumonia Vaccine  Completed   Hepatitis C Screening: USPSTF Recommendation to screen - Ages 67-79 yo.  Completed   HPV Vaccine  Aged Out   DTaP/Tdap/Td vaccine  Discontinued  *Topic was postponed. The date shown is not the original due date.     Preventive Care 28 Years and Older, Male Preventive care refers to lifestyle choices and visits with your health care provider that can promote health and wellness. Preventive care visits are also called wellness exams. What can I expect for my preventive care visit? Counseling During your preventive care visit, your health care provider may ask about your: Medical history, including: Past medical problems. Family medical history. History of falls. Current health, including: Emotional well-being. Home life and relationship well-being. Sexual activity. Memory and ability to understand (cognition). Lifestyle, including: Alcohol, nicotine or tobacco, and drug use. Access to firearms. Diet, exercise, and sleep habits. Work and work Statistician. Sunscreen use. Safety issues such as seatbelt and bike helmet use. Physical exam Your health care provider will check your: Height and weight. These may be used to  calculate your BMI (body mass index). BMI is a measurement that tells if you are at a healthy weight. Waist circumference. This measures the distance around your waistline. This measurement also tells if you are at a healthy weight and may help predict your risk of certain diseases, such as type 2 diabetes and high blood pressure. Heart rate and blood pressure. Body temperature. Skin for abnormal spots. What immunizations do I need?  Vaccines are usually given at various ages, according to a schedule. Your health care provider will recommend vaccines for you based on your age, medical history, and lifestyle or other factors, such as travel or where you work. What tests do I need? Screening Your health care provider may recommend screening tests for certain conditions. This may include: Lipid and cholesterol levels. Diabetes screening. This is done by checking your blood sugar (glucose) after you have not eaten for a while (fasting). Hepatitis C test. Hepatitis B test. HIV (human immunodeficiency virus) test. STI (sexually transmitted infection) testing, if you are at risk. Lung cancer screening. Colorectal cancer screening. Prostate cancer screening. Abdominal aortic aneurysm (AAA) screening. You may need this if you are a current or former smoker. Talk with your health care provider about your test results, treatment options, and if necessary, the need for more tests. Follow these instructions at home: Eating and drinking  Eat a diet that includes fresh fruits and vegetables, whole grains, lean protein, and low-fat dairy products. Limit your intake of foods with high amounts of sugar, saturated fats, and salt. Take vitamin and mineral supplements as recommended by your health  care provider. Do not drink alcohol if your health care provider tells you not to drink. If you drink alcohol: Limit how much you have to 0-2 drinks a day. Know how much alcohol is in your drink. In the [place] one  drink equals one 12 oz bottle of beer (355 mL), one 5 oz glass of wine (148 mL), or one 1 oz glass of hard liquor (44 mL). Lifestyle Brush your teeth every morning and night with fluoride toothpaste. Floss one time each day. Exercise for at least 30 minutes 5 or more days each week. Do not use any products that contain nicotine or tobacco. These products include cigarettes, chewing tobacco, and vaping devices, such as e-cigarettes. If you need help quitting, ask your health care provider. Do not use drugs. If you are sexually active, practice safe sex. Use a condom or other form of protection to prevent STIs. Take aspirin only as told by your health care provider. Make sure that you understand how much to take and what form to take. Work with your health care provider to find out whether it is safe and beneficial for you to take aspirin daily. Ask your health care provider if you need to take a cholesterol-lowering medicine (statin). Find healthy ways to manage stress, such as: Meditation, yoga, or listening to music. Journaling. Talking to a trusted person. Spending time with friends and family. Safety Always wear your seat belt while driving or riding in a vehicle. Do not drive: If you have been drinking alcohol. Do not ride with someone who has been drinking. When you are tired or distracted. While texting. If you have been using any mind-altering substances or drugs. Wear a helmet and other protective equipment during sports activities. If you have firearms in your house, make sure you follow all gun safety procedures. Minimize exposure to UV radiation to reduce your risk of skin cancer. What's next? Visit your health care provider once a year for an annual wellness visit. Ask your health care provider how often you should have your eyes and teeth checked. Stay up to date on all vaccines. This information is not intended to replace advice given to you by your health care provider.  Make sure you discuss any questions you have with your health care provider. Document Revised: 08/06/2020 Document Reviewed: 08/06/2020 Elsevier Patient Education  [place].

## 2022-03-24 NOTE — Assessment & Plan Note (Signed)
Repeat hgbA1c 

## 2022-03-24 NOTE — Progress Notes (Signed)
Annual Wellness Visit   Patient: Ronald Lara, Male    DOB: [birth year], 72 y.o.   MRN: [id number] Visit Date: 03/24/2022  Subjective:    Chief Complaint  Patient presents with   Annual Exam    Pt is here for his physical.  He is not fasting. He had eggs and chicken this morning.   Alika Saladin is a 72 y.o. male who presents today for his Annual Wellness Visit. He reports consuming a general diet.  Walking daily and home chores.  He generally feels well. He reports sleeping well. He does not have additional problems to discuss today.  Vision:No Dental:No STD Screen:No PSA:Yes  HPI Positive colorectal cancer screening using Cologuard test Agreed to to GI referral  Hyperglycemia Repeat hgbA1c  Abnormal vision screen Entered ophthalmology referral  Patient Active Problem List   Diagnosis Date Noted   Abnormal vision screen 03/24/2022   Hyperglycemia 11/25/2020   Positive colorectal cancer screening using Cologuard test 10/05/2019   Sinus arrhythmia 02/25/2017   Social History   Tobacco Use   Smoking status: Never   Smokeless tobacco: Never  Vaping Use   Vaping Use: Never used  Substance Use Topics   Alcohol use: No   Drug use: No   Family History  Problem Relation Age of Onset   Diabetes Mother    Heart disease Brother    Medications: Outpatient Medications Prior to Visit  Medication Sig   [DISCONTINUED] clotrimazole-betamethasone (LOTRISONE) cream APPLY  CREAM TOPICALLY TWICE DAILY (Patient not taking: Reported on 11/25/2020)   [DISCONTINUED] triamcinolone cream (KENALOG) 0.1 % APPLY  CREAM EXTERNALLY TO AFFECTED AREA TWICE DAILY (Patient not taking: Reported on 03/24/2022)   No facility-administered medications prior to visit.    No Known Allergies  Patient Care Team: Alhaji Mcneal, Charlene Brooke, NP as PCP - General (Internal Medicine)  Review of Systems  Constitutional:  Negative for fever.  HENT:  Negative for congestion and sore throat.   Eyes:         Negative for visual changes  Respiratory:  Negative for cough and shortness of breath.   Cardiovascular:  Negative for chest pain, palpitations and leg swelling.  Gastrointestinal:  Negative for blood in stool, constipation and diarrhea.  Genitourinary:  Negative for dysuria, frequency and urgency.  Musculoskeletal:  Negative for myalgias.  Skin:  Negative for rash.  Neurological:  Negative for dizziness and headaches.  Hematological:  Does not bruise/bleed easily.  Psychiatric/Behavioral:  Negative for suicidal ideas. The patient is not nervous/anxious.    Last metabolic panel Lab Results  Component Value Date   GLUCOSE 90 11/25/2020   NA 141 11/25/2020   K 4.1 11/25/2020   CL 103 11/25/2020   CO2 29 11/25/2020   BUN 16 11/25/2020   CREATININE 1.01 11/25/2020   CALCIUM 9.8 11/25/2020   PROT 7.8 11/25/2020   ALBUMIN 4.5 11/25/2020   BILITOT 0.3 11/25/2020   ALKPHOS 48 11/25/2020   AST 23 11/25/2020   ALT 18 11/25/2020   Last lipids Lab Results  Component Value Date   CHOL 169 11/25/2020   HDL 42.60 11/25/2020   LDLCALC 107 (H) 11/25/2020   TRIG 98.0 11/25/2020   CHOLHDL 4 11/25/2020   Last hemoglobin A1c Lab Results  Component Value Date   HGBA1C 6.1 11/25/2020   Last thyroid functions Lab Results  Component Value Date   TSH 2.65 11/25/2020     Objective:    Vitals: BP 124/78 (BP Location: Left Arm, Patient Position: Sitting, Cuff  Size: Normal)   Pulse 68   Temp 98.2 F (36.8 C) (Oral)   Resp 16   Ht 5\' 7"  (1.702 m)   Wt 184 lb (83.5 kg)   SpO2 98%   BMI 28.82 kg/m  BP Readings from Last 3 Encounters:  03/24/22 124/78  11/25/20 132/68  10/05/19 128/80   Wt Readings from Last 3 Encounters:  03/24/22 184 lb (83.5 kg)  11/25/20 198 lb 3.2 oz (89.9 kg)  10/05/19 197 lb 6.4 oz (89.5 kg)   Physical Exam Vitals reviewed.  Constitutional:      General: He is not in acute distress.    Appearance: He is well-developed.  HENT:     Right Ear:  Tympanic membrane, ear canal and external ear normal.     Left Ear: Tympanic membrane, ear canal and external ear normal.     Nose: Nose normal.     Mouth/Throat:     Pharynx: No oropharyngeal exudate.  Eyes:     Extraocular Movements: Extraocular movements intact.     Conjunctiva/sclera: Conjunctivae normal.     Pupils: Pupils are equal, round, and reactive to light.  Cardiovascular:     Rate and Rhythm: Normal rate and regular rhythm.     Heart sounds: Normal heart sounds.  Pulmonary:     Effort: Pulmonary effort is normal. No respiratory distress.     Breath sounds: Normal breath sounds.  Chest:     Chest wall: No tenderness.  Abdominal:     General: Bowel sounds are normal.     Palpations: Abdomen is soft.  Musculoskeletal:        General: Normal range of motion.     Cervical back: Normal range of motion and neck supple.     Right lower leg: No edema.     Left lower leg: No edema.  Skin:    General: Skin is warm and dry.  Neurological:     Mental Status: He is alert and oriented to person, place, and time.     Deep Tendon Reflexes: Reflexes are normal and symmetric.  Psychiatric:        Mood and Affect: Mood normal.        Behavior: Behavior normal.     Most recent functional status assessment:     No data to display         Most recent fall risk assessment:    03/24/2022   10:34 AM  Fall Risk   Falls in the past year? 0  Number falls in past yr: 0  Injury with Fall? 0   Most recent depression screenings:    03/24/2022   11:38 AM 03/24/2022   10:35 AM  PHQ 2/9 Scores  PHQ - 2 Score 0 0  PHQ- 9 Score 0       03/24/2022   11:23 AM 11/25/2020    2:50 PM  MMSE - Mini Mental State Exam  Orientation to time 5 5  Orientation to Place 5 5  Registration 3 3  Attention/ Calculation 5 5  Recall 0 3  Language- name 2 objects 2 2  Language- repeat 1 1  Language- follow 3 step command 3 3  Language- read & follow direction 1 1  Write a sentence 1 1  Copy  design 1 1  Total score 27 30    Hearing Screening   125Hz  250Hz  500Hz  1000Hz  2000Hz  3000Hz   Right ear Pass Pass Pass Pass Pass Pass  Left ear Pass Pass Pass Pass  Pass Pass   Vision Screening   Right eye Left eye Both eyes  Without correction 20/70 20/70 20/50   With correction      No results found for any visits on 03/24/22.     Assessment & Plan:      Annual wellness visit done today including the all of the following: Reviewed patient's Family Medical History Reviewed and updated list of patient's medical providers Assessment of cognitive impairment was done Assessed patient's functional ability Established a written schedule for health screening services Health Risk Assessent Completed and Reviewed  Exercise Activities and Dietary recommendations  Goals   None    Immunization History  Administered Date(s) Administered   Fluad Quad(high Dose 65+) 11/25/2020   Influenza, High Dose Seasonal PF 02/27/2018   Influenza-Unspecified 12/02/2016   Pneumococcal Conjugate-13 01/27/2017   Pneumococcal Polysaccharide-23 12/02/2016   Zoster, Live 12/02/2016   Health Maintenance  Topic Date Due   Medicare Annual Wellness (AWV)  11/25/2021   COVID-19 Vaccine (1) 04/09/2022 (Originally 07/05/1951)   INFLUENZA VACCINE  05/23/2022 (Originally 09/22/2021)   COLONOSCOPY (Pts 45-25yrs Insurance coverage will need to be confirmed)  03/25/2023 (Originally 01/05/1995)   Zoster Vaccines- Shingrix (1 of 2) 03/25/2023 (Originally 01/05/2000)   Pneumonia Vaccine 73+ Years old  Completed   Hepatitis C Screening  Completed   HPV VACCINES  Aged Out   DTaP/Tdap/Td  Discontinued   Discussed health benefits of physical activity, and encouraged him to engage in regular exercise appropriate for his age and condition.  Schedule appt with GI for colon cancer screen- [phone] Schedule appt with ophthalmology due to poor vision and Need to screen for glaucoma and cataract.  Problem List Items  Addressed This Visit       Other   Abnormal vision screen    Entered ophthalmology referral      Relevant Orders   Ambulatory referral to Ophthalmology   Hyperglycemia    Repeat hgbA1c      Relevant Orders   Hemoglobin A1c   Positive colorectal cancer screening using Cologuard test    Agreed to to GI referral      Relevant Orders   Ambulatory referral to Gastroenterology   Other Visit Diagnoses     Encounter for annual wellness visit (AWV) in Medicare patient    -  Primary   Prostate cancer screening       Relevant Orders   PSA, Medicare   Encounter for preventative adult health care exam with abnormal findings       Relevant Orders   Comprehensive metabolic panel   Encounter for lipid screening for cardiovascular disease       Relevant Orders   Lipid panel      Return in about 1 year (around 03/25/2023) for CPE (fasting).     Wilfred Lacy, NP

## 2022-04-23 ENCOUNTER — Encounter: Payer: Self-pay | Admitting: Nurse Practitioner

## 2023-06-09 ENCOUNTER — Ambulatory Visit

## 2023-06-09 ENCOUNTER — Telehealth: Payer: Self-pay | Admitting: Nurse Practitioner

## 2023-06-09 NOTE — Telephone Encounter (Signed)
 Copied from CRM [id number]. Topic: Appointments - Appointment Scheduling >> Jun 09, 2023 12:58 PM Kita Perish H wrote: Patient was calling to reschedule appointment with clinician for his telephone AWV, agent was unable to schedule no solutions found, please reach out to patient, agent updated phone number.  [place] [phone]

## 2023-06-09 NOTE — Telephone Encounter (Signed)
 I would have rescheduled this appt, but it looks like it's at [facility], I think that's [facility]. Soyla Duverney is at [employer] [facility]. Thank you for your help.

## 2024-04-05 ENCOUNTER — Encounter: Admitting: Nurse Practitioner
